# Patient Record
Sex: Male | Born: 1945 | Race: Black or African American | Hispanic: No | Smoking: Former smoker
Health system: Southern US, Community
[De-identification: ages and names within clinical notes are randomized; demographics above are authoritative.]

## PROBLEM LIST (undated history)

## (undated) DIAGNOSIS — Z720 Tobacco use: Secondary | ICD-10-CM

## (undated) DIAGNOSIS — M7989 Other specified soft tissue disorders: Secondary | ICD-10-CM

## (undated) DIAGNOSIS — R0602 Shortness of breath: Secondary | ICD-10-CM

## (undated) DIAGNOSIS — R609 Edema, unspecified: Secondary | ICD-10-CM

## (undated) DIAGNOSIS — E7800 Pure hypercholesterolemia, unspecified: Secondary | ICD-10-CM

## (undated) DIAGNOSIS — R5381 Other malaise: Secondary | ICD-10-CM

## (undated) DIAGNOSIS — K219 Gastro-esophageal reflux disease without esophagitis: Secondary | ICD-10-CM

## (undated) DIAGNOSIS — I499 Cardiac arrhythmia, unspecified: Secondary | ICD-10-CM

## (undated) DIAGNOSIS — M1990 Unspecified osteoarthritis, unspecified site: Secondary | ICD-10-CM

## (undated) DIAGNOSIS — R5383 Other fatigue: Secondary | ICD-10-CM

## (undated) DIAGNOSIS — I10 Essential (primary) hypertension: Secondary | ICD-10-CM

## (undated) DIAGNOSIS — I509 Heart failure, unspecified: Secondary | ICD-10-CM

## (undated) DIAGNOSIS — I219 Acute myocardial infarction, unspecified: Secondary | ICD-10-CM

## (undated) DIAGNOSIS — I2510 Atherosclerotic heart disease of native coronary artery without angina pectoris: Secondary | ICD-10-CM

## (undated) HISTORY — DX: Atherosclerotic heart disease of native coronary artery without angina pectoris: I25.10

## (undated) HISTORY — DX: Unspecified osteoarthritis, unspecified site: M19.90

## (undated) HISTORY — PX: CARDIAC CATHETERIZATION: SHX172

## (undated) HISTORY — DX: Heart failure, unspecified: I50.9

## (undated) HISTORY — DX: Other malaise: R53.81

## (undated) HISTORY — DX: Essential (primary) hypertension: I10

## (undated) HISTORY — DX: Edema, unspecified: R60.9

## (undated) HISTORY — DX: Tobacco use: Z72.0

## (undated) HISTORY — DX: Pure hypercholesterolemia, unspecified: E78.00

## (undated) HISTORY — DX: Other fatigue: R53.83

## (undated) HISTORY — DX: Morbid (severe) obesity due to excess calories: E66.01

## (undated) HISTORY — PX: CORONARY ANGIOPLASTY WITH STENT PLACEMENT: SHX49

## (undated) HISTORY — DX: Other specified soft tissue disorders: M79.89

---

## 2004-11-14 DIAGNOSIS — I2510 Atherosclerotic heart disease of native coronary artery without angina pectoris: Secondary | ICD-10-CM

## 2004-11-14 HISTORY — DX: Atherosclerotic heart disease of native coronary artery without angina pectoris: I25.10

## 2012-01-26 DIAGNOSIS — R079 Chest pain, unspecified: Secondary | ICD-10-CM

## 2012-01-30 ENCOUNTER — Encounter: Payer: Self-pay | Admitting: *Deleted

## 2012-01-30 ENCOUNTER — Ambulatory Visit (INDEPENDENT_AMBULATORY_CARE_PROVIDER_SITE_OTHER): Payer: Medicare Other | Admitting: Cardiovascular Disease

## 2012-01-30 ENCOUNTER — Encounter: Payer: Self-pay | Admitting: Cardiovascular Disease

## 2012-01-30 ENCOUNTER — Telehealth: Payer: Self-pay | Admitting: *Deleted

## 2012-01-30 VITALS — BP 123/68 | HR 65 | Ht 66.0 in | Wt 290.0 lb

## 2012-01-30 DIAGNOSIS — F17200 Nicotine dependence, unspecified, uncomplicated: Secondary | ICD-10-CM

## 2012-01-30 DIAGNOSIS — Z720 Tobacco use: Secondary | ICD-10-CM

## 2012-01-30 DIAGNOSIS — I2510 Atherosclerotic heart disease of native coronary artery without angina pectoris: Secondary | ICD-10-CM

## 2012-01-30 DIAGNOSIS — R079 Chest pain, unspecified: Secondary | ICD-10-CM

## 2012-01-30 DIAGNOSIS — R0602 Shortness of breath: Secondary | ICD-10-CM

## 2012-01-30 DIAGNOSIS — I10 Essential (primary) hypertension: Secondary | ICD-10-CM

## 2012-01-30 DIAGNOSIS — E7800 Pure hypercholesterolemia, unspecified: Secondary | ICD-10-CM

## 2012-01-30 MED ORDER — NITROGLYCERIN 0.4 MG SL SUBL: 0.4000 mg | SUBLINGUAL_TABLET | SUBLINGUAL | Status: DC | PRN

## 2012-01-30 NOTE — Patient Instructions (Addendum)
Go to outpatient registration at [facility] for lab work and a chest x-ray: DO TODAY OR TOMORROW. Your physician has requested that you have a cardiac catheterization. Cardiac catheterization is used to diagnose and/or treat various heart conditions. Doctors may recommend this procedure for a number of different reasons. The most common reason is to evaluate chest pain. Chest pain can be a symptom of coronary artery disease (CAD), and cardiac catheterization can show whether plaque is narrowing or blocking your heart's arteries. This procedure is also used to evaluate the valves, as well as measure the blood flow and oxygen levels in different parts of your heart. For further information please visit [email or web address] Please follow instruction sheet, as given. Nitroglycerin-Place one tablet under tongue every 5 minutes up to 3 doses as needed for chest pain. No more than 3 doses over a 15 minute period.

## 2012-01-30 NOTE — Telephone Encounter (Signed)
Spoke with pt and notified him regarding abn stress test. He states he had a stent placed about 5 yrs ago in [place], [place]. He will come into the office around 1 following his Echo at [facility].   Requested records from [facility].

## 2012-01-30 NOTE — Telephone Encounter (Signed)
No precert required.  Medicare only 

## 2012-01-30 NOTE — Telephone Encounter (Signed)
Spoke with [name] at [employer]. She confirmed phone number of [phone]. Pt has appt for Echo with them at 11am today. Notified [name] that Dr. Kirke Corin would like to see pt in the office today to discuss abn stress test. She will confirm with pt at time of Echo that he has spoken with [place].  Left message for pt to call back on voicemail.

## 2012-01-30 NOTE — Progress Notes (Signed)
HPI   this is a 66 year old male who is here today for an urgent evaluation Re: chest pain and significantly abnormal nuclear stress test. The patient has known history of coronary artery disease status post angioplasty and stent placement in 2006 in [place], [place].  He has not had any cardiac events since then and has been on dual antiplatelet therapy since that time. He has chronic medical conditions that include diet-controlled type 2 diabetes , hypertension , hyperlipidemia , tobacco use and morbid obesity.  He recently went to a regular check up with his primary care physician Dr. Sherryll Burger. At that time he complained of lower extremity edema as well as progressive chest discomfort. He had a lower extremity Doppler which showed no evidence of DVT. The patient reports progressive substernal chest tightness which happens only with activities. This started about a year ago and has been progressive since then. The patient gradually cut down his activities as he knows when to expect to have chest tightness.  The tightness usually lasts for about 5 or 10 minutes and resolves with rest. He has not had to use nitroglycerin and does not have it. He underwent a nuclear stress test on Friday. He had 1 mm of ST depression in the inferior and inferior lateral leads associated with chest tightness and few PVCs. His nuclear scan showed evidence of 2 vessel ischemia in the LAD as well as left circumflex distributions with normal ejection fraction.  Allergies  Allergen Reactions  . Ciprofloxacin Hcl Other (See Comments)    Fluoroquinolones:difficulty swallowing and Sore mouth     No current outpatient prescriptions on file prior to visit.     Past Medical History  Diagnosis Date  . Other malaise and fatigue   . Swelling of limb   . Edema   . Osteoarthrosis, unspecified whether generalized or localized, unspecified site   . Congestive heart failure, unspecified   . CAD (coronary artery disease), native  coronary artery 2006    Stent placed in 2006 in [place], [place]  . Pure hypercholesterolemia   . Hypertension   . Morbid obesity   . Tobacco abuse      History reviewed. No pertinent past surgical history.   History reviewed. No pertinent family history.   History   Social History  . Marital Status: Divorced    Spouse Name: N/A    Number of Children: N/A  . Years of Education: N/A   Occupational History  . UNEMPLOYED    Social History Main Topics  . Smoking status: Current Everyday Smoker -- 0.5 packs/day for 30 years    Types: Cigarettes  . Smokeless tobacco: Never Used  . Alcohol Use: Not on file  . Drug Use: Not on file  . Sexually Active: Not on file   Other Topics Concern  . Not on file   Social History Narrative  . No narrative on file     ROS Constitutional: Negative for fever, chills, diaphoresis, activity change, appetite change and fatigue.  HENT: Negative for hearing loss, nosebleeds, congestion, sore throat, facial swelling, drooling, trouble swallowing, neck pain, voice change, sinus pressure and tinnitus.  Eyes: Negative for photophobia, pain, discharge and visual disturbance.  Respiratory: Negative for apnea, cough  and wheezing.  Cardiovascular: Negative for  palpitations.  Gastrointestinal: Negative for nausea, vomiting, abdominal pain, diarrhea, constipation, blood in stool and abdominal distention.  Genitourinary: Negative for dysuria, urgency, frequency, hematuria and decreased urine volume.  Musculoskeletal: Negative for myalgias, back pain, joint swelling,  arthralgias and gait problem.  Skin: Negative for color change, pallor, rash and wound.  Neurological: Negative for dizziness, tremors, seizures, syncope, speech difficulty, weakness, light-headedness, numbness and headaches.  Psychiatric/Behavioral: Negative for suicidal ideas, hallucinations, behavioral problems and agitation. The patient is not nervous/anxious.     PHYSICAL EXAM   BP  123/68  Pulse 65  Ht 5\' 6"  (1.676 m)  Wt 290 lb (131.543 kg)  BMI 46.81 kg/m2  SpO2 96%  Constitutional: He is oriented to person, place, and time. He appears well-developed and well-nourished. No distress.  HENT: No nasal discharge.  Head: Normocephalic and atraumatic.  Eyes: Pupils are equal and round. Right eye exhibits no discharge. Left eye exhibits no discharge.  Neck: Normal range of motion. Neck supple. No JVD present. No thyromegaly present.  Cardiovascular: Normal rate, regular rhythm, normal heart sounds and. Exam reveals no gallop and no friction rub. No murmur heard.  Pulmonary/Chest: Effort normal and breath sounds normal. No stridor. No respiratory distress. He has no wheezes. He has no rales. He exhibits no tenderness.  Abdominal: Soft. Bowel sounds are normal. He exhibits no distension. There is no tenderness. There is no rebound and no guarding.  Musculoskeletal: Normal range of motion. He exhibits no edema and no tenderness.  Neurological: He is alert and oriented to person, place, and time. Coordination normal.  Skin: Skin is warm and dry. No rash noted. He is not diaphoretic. No erythema. No pallor.  Psychiatric: He has a normal mood and affect. His behavior is normal. Judgment and thought content normal.   radial pulse is normal on both sides    EKG:  Normal sinus rhythm with nonspecific ST and T wave changes.   ASSESSMENT AND PLAN

## 2012-01-30 NOTE — Telephone Encounter (Signed)
Pre-cert Cath (L) heart (radial approach) Main Lab Dr. Excell Seltzer 02/06/12

## 2012-01-30 NOTE — Assessment & Plan Note (Signed)
Mr. Galyean  Has known history of coronary artery disease status post angioplasty and stent placement in 2006. The details are not available. He has not had any cardiac events since then and has been on dual antiplatelet therapy since that time. He reports progressive exertional chest tightness currently consistent with class III angina in spite of medical therapy. His stress test was very abnormal with significant anteroseptal/ anterior ischemia as well as inferolateral ischemia with normal ejection fraction.  Due to all of that, I recommend proceeding with urgent cardiac catheterization and possible coronary intervention. I advised him to have the procedure done this week but he is not able to due to some social reasons. He would like it done next week. He will be scheduled next week on Monday  with Dr. Excell Seltzer  As I am not available to do the procedure this week or next week.  Due to his morbid obesity, I recommend radial acces approach.  He is to continue treatment with aspirin and Plavix. He will be provided with nitroglycerin sublingual as needed. I instructed him on the proper use as well as the need to call [phone] if symptoms do not resolve after one nitroglycerin.

## 2012-01-30 NOTE — Assessment & Plan Note (Signed)
Continue treatment with atorvastatin. I recommend a target LDL of less than 70. 

## 2012-01-30 NOTE — Assessment & Plan Note (Signed)
His blood pressure is well controlled. Continue current medications. 

## 2012-01-30 NOTE — Telephone Encounter (Signed)
Message copied by Arlyss Gandy on Mon Jan 30, 2012  8:26 AM ------      Message from: Lorine Bears A      Created: Fri Jan 27, 2012  5:46 PM      Regarding: abnormal stress test       This patient had a stress test today (Friday). It was very abnormal. I tried calling him but the number did not work.       He needs to be seen urgently on Monday to set up for cardiac cath. His PCP is Dr. Sherryll Burger. Let me know

## 2012-01-30 NOTE — Assessment & Plan Note (Signed)
The patient will need to be counseled regarding the importance of smoking cessation.

## 2012-01-31 ENCOUNTER — Encounter (HOSPITAL_COMMUNITY): Payer: Self-pay | Admitting: Pharmacy Technician

## 2012-02-06 ENCOUNTER — Ambulatory Visit (HOSPITAL_COMMUNITY): Payer: Medicare Other

## 2012-02-06 ENCOUNTER — Encounter (HOSPITAL_COMMUNITY)
Admission: RE | Disposition: A | Discharge status: Home / Self Care | Payer: Self-pay | Source: Ambulatory Visit | Attending: Cardiovascular Disease

## 2012-02-06 ENCOUNTER — Other Ambulatory Visit: Payer: Self-pay

## 2012-02-06 ENCOUNTER — Ambulatory Visit (HOSPITAL_COMMUNITY)
Admission: RE | Admit: 2012-02-06 | Discharge: 2012-02-07 | Disposition: A | Discharge status: Home / Self Care | Payer: Medicare Other | Source: Ambulatory Visit | Attending: Cardiovascular Disease | Admitting: Cardiovascular Disease

## 2012-02-06 ENCOUNTER — Encounter (HOSPITAL_COMMUNITY): Payer: Self-pay | Admitting: General Practice

## 2012-02-06 DIAGNOSIS — I2510 Atherosclerotic heart disease of native coronary artery without angina pectoris: Secondary | ICD-10-CM

## 2012-02-06 DIAGNOSIS — Z720 Tobacco use: Secondary | ICD-10-CM | POA: Insufficient documentation

## 2012-02-06 DIAGNOSIS — K029 Dental caries, unspecified: Secondary | ICD-10-CM | POA: Insufficient documentation

## 2012-02-06 DIAGNOSIS — I5030 Unspecified diastolic (congestive) heart failure: Secondary | ICD-10-CM | POA: Insufficient documentation

## 2012-02-06 DIAGNOSIS — F17200 Nicotine dependence, unspecified, uncomplicated: Secondary | ICD-10-CM | POA: Insufficient documentation

## 2012-02-06 DIAGNOSIS — Z01810 Encounter for preprocedural cardiovascular examination: Secondary | ICD-10-CM | POA: Insufficient documentation

## 2012-02-06 DIAGNOSIS — Z9861 Coronary angioplasty status: Secondary | ICD-10-CM | POA: Insufficient documentation

## 2012-02-06 DIAGNOSIS — R079 Chest pain, unspecified: Secondary | ICD-10-CM

## 2012-02-06 DIAGNOSIS — I509 Heart failure, unspecified: Secondary | ICD-10-CM | POA: Insufficient documentation

## 2012-02-06 DIAGNOSIS — I10 Essential (primary) hypertension: Secondary | ICD-10-CM | POA: Insufficient documentation

## 2012-02-06 DIAGNOSIS — I200 Unstable angina: Secondary | ICD-10-CM | POA: Insufficient documentation

## 2012-02-06 DIAGNOSIS — E119 Type 2 diabetes mellitus without complications: Secondary | ICD-10-CM | POA: Insufficient documentation

## 2012-02-06 DIAGNOSIS — E7800 Pure hypercholesterolemia, unspecified: Secondary | ICD-10-CM | POA: Insufficient documentation

## 2012-02-06 HISTORY — PX: LEFT HEART CATHETERIZATION WITH CORONARY ANGIOGRAM: SHX5451

## 2012-02-06 HISTORY — DX: Acute myocardial infarction, unspecified: I21.9

## 2012-02-06 HISTORY — DX: Shortness of breath: R06.02

## 2012-02-06 LAB — GLUCOSE, CAPILLARY
Glucose-Capillary: 112 mg/dL — ABNORMAL HIGH (ref 70–99)
Glucose-Capillary: 89 mg/dL (ref 70–99)
Glucose-Capillary: 105 mg/dL — ABNORMAL HIGH (ref 70–99)

## 2012-02-06 SURGERY — LEFT HEART CATHETERIZATION WITH CORONARY ANGIOGRAM
Anesthesia: LOCAL

## 2012-02-06 MED ORDER — HEPARIN (PORCINE) IN NACL 100-0.45 UNIT/ML-% IJ SOLN
1550.0000 [IU]/h | INTRAMUSCULAR | Status: DC
  Administered 2012-02-06: 1300 [IU]/h via INTRAVENOUS
  Administered 2012-02-07: 1550 [IU]/h via INTRAVENOUS
  Filled 2012-02-06 (×3): qty 250

## 2012-02-06 MED ORDER — NITROGLYCERIN 0.4 MG SL SUBL: 0.4000 mg | SUBLINGUAL_TABLET | SUBLINGUAL | Status: DC | PRN

## 2012-02-06 MED ORDER — ATORVASTATIN CALCIUM 40 MG PO TABS
40.0000 mg | ORAL_TABLET | Freq: Every day | ORAL | Status: DC
  Administered 2012-02-07: 40 mg via ORAL
  Filled 2012-02-06: qty 1

## 2012-02-06 MED ORDER — ACETAMINOPHEN 325 MG PO TABS: 650.0000 mg | ORAL_TABLET | ORAL | Status: DC | PRN

## 2012-02-06 MED ORDER — METOPROLOL TARTRATE 25 MG PO TABS
25.0000 mg | ORAL_TABLET | Freq: Every day | ORAL | Status: DC
  Administered 2012-02-07: 25 mg via ORAL
  Filled 2012-02-06: qty 1

## 2012-02-06 MED ORDER — ASPIRIN 81 MG PO CHEW
CHEWABLE_TABLET | ORAL | Status: AC
  Administered 2012-02-06: 324 mg via ORAL
  Filled 2012-02-06: qty 4

## 2012-02-06 MED ORDER — SODIUM CHLORIDE 0.9 % IJ SOLN
3.0000 mL | Freq: Two times a day (BID) | INTRAMUSCULAR | Status: DC
  Administered 2012-02-06 – 2012-02-07 (×2): 3 mL via INTRAVENOUS

## 2012-02-06 MED ORDER — SODIUM CHLORIDE 0.9 % IJ SOLN: 3.0000 mL | INTRAMUSCULAR | Status: DC | PRN

## 2012-02-06 MED ORDER — SODIUM CHLORIDE 0.9 % IV SOLN
INTRAVENOUS | Status: DC
  Administered 2012-02-06: 07:00:00 via INTRAVENOUS

## 2012-02-06 MED ORDER — ASPIRIN 81 MG PO CHEW
324.0000 mg | CHEWABLE_TABLET | ORAL | Status: AC
  Administered 2012-02-06: 324 mg via ORAL

## 2012-02-06 MED ORDER — ALBUTEROL SULFATE (5 MG/ML) 0.5% IN NEBU
2.5000 mg | INHALATION_SOLUTION | Freq: Once | RESPIRATORY_TRACT | Status: AC
  Administered 2012-02-06: 2.5 mg via RESPIRATORY_TRACT

## 2012-02-06 MED ORDER — LISINOPRIL 10 MG PO TABS
10.0000 mg | ORAL_TABLET | Freq: Every day | ORAL | Status: DC
  Administered 2012-02-07: 10 mg via ORAL
  Filled 2012-02-06: qty 1

## 2012-02-06 MED ORDER — MIDAZOLAM HCL 2 MG/2ML IJ SOLN
INTRAMUSCULAR | Status: AC
  Filled 2012-02-06: qty 2

## 2012-02-06 MED ORDER — SODIUM CHLORIDE 0.9 % IV SOLN
1.0000 mL/kg/h | INTRAVENOUS | Status: AC
  Administered 2012-02-06: 1 mL/kg/h via INTRAVENOUS

## 2012-02-06 MED ORDER — SODIUM CHLORIDE 0.9 % IV SOLN: 250.0000 mL | INTRAVENOUS | Status: DC | PRN

## 2012-02-06 MED ORDER — COLCHICINE 0.6 MG PO TABS
0.6000 mg | ORAL_TABLET | Freq: Two times a day (BID) | ORAL | Status: DC | PRN
  Filled 2012-02-06: qty 1

## 2012-02-06 MED ORDER — LIDOCAINE HCL (PF) 1 % IJ SOLN
INTRAMUSCULAR | Status: AC
  Filled 2012-02-06: qty 30

## 2012-02-06 MED ORDER — SODIUM CHLORIDE 0.9 % IV SOLN: 250.0000 mL | INTRAVENOUS | Status: DC

## 2012-02-06 MED ORDER — ASPIRIN EC 81 MG PO TBEC
81.0000 mg | DELAYED_RELEASE_TABLET | Freq: Every day | ORAL | Status: DC
  Administered 2012-02-07: 81 mg via ORAL
  Filled 2012-02-06: qty 1

## 2012-02-06 MED ORDER — FENTANYL CITRATE 0.05 MG/ML IJ SOLN
INTRAMUSCULAR | Status: AC
  Filled 2012-02-06: qty 2

## 2012-02-06 MED ORDER — ONDANSETRON HCL 4 MG/2ML IJ SOLN: 4.0000 mg | Freq: Four times a day (QID) | INTRAMUSCULAR | Status: DC | PRN

## 2012-02-06 MED ORDER — SODIUM CHLORIDE 0.9 % IJ SOLN: 3.0000 mL | Freq: Two times a day (BID) | INTRAMUSCULAR | Status: DC

## 2012-02-06 MED ORDER — FUROSEMIDE 40 MG PO TABS
40.0000 mg | ORAL_TABLET | Freq: Every day | ORAL | Status: DC
  Administered 2012-02-06 – 2012-02-07 (×2): 40 mg via ORAL
  Filled 2012-02-06 (×2): qty 1

## 2012-02-06 MED ORDER — HEPARIN (PORCINE) IN NACL 2-0.9 UNIT/ML-% IJ SOLN
INTRAMUSCULAR | Status: AC
  Filled 2012-02-06: qty 2000

## 2012-02-06 MED ORDER — NITROGLYCERIN 0.2 MG/ML ON CALL CATH LAB
INTRAVENOUS | Status: AC
  Filled 2012-02-06: qty 1

## 2012-02-06 NOTE — Consult Note (Signed)
Reason for Consult:3 vessel CAD Referring Physician: Dr. Michael Boston [name] is an 67 y.o. male.  HPI: 66 yo male with history of CAD, s/p PTCA/ stent in [place] in 2006. On aspirin and Plavix since that time. He is morbidly obese, continues to smoke and has HTN and hypercholesterolemia. He has been having CP for about a year. Usually exertional, relieved with rest and he has not had to take NTG. He says CP has awakened him from sleep a couple of times, but not recently. He had a cardiolite which showed significant ischemia and was scheduled for cath today. At cath was found to have 3 vessel CAD. Currently pain free.  Past Medical History  Diagnosis Date  . Other malaise and fatigue   . Swelling of limb   . Edema   . Osteoarthrosis, unspecified whether generalized or localized, unspecified site   . Congestive heart failure, unspecified   . CAD (coronary artery disease), native coronary artery 2006    Stent placed in 2006 in [place], [place]  . Pure hypercholesterolemia   . Hypertension   . Morbid obesity   . Tobacco abuse   . Myocardial infarction   . Shortness of breath   . Diabetes mellitus     Past Surgical History  Procedure Date  . Cardiac catheterization   . Coronary angioplasty with stent placement     History reviewed. No pertinent family history.  Social History:  reports that he has been smoking Cigarettes.  He has a 15 pack-year smoking history. He has never used smokeless tobacco. He reports that he does not drink alcohol or use illicit drugs.  Allergies:  Allergies  Allergen Reactions  . Ciprofloxacin Hcl Other (See Comments)    Fluoroquinolones:difficulty swallowing and Sore mouth    Medications:  I have reviewed the patient's current medications. Prior to Admission:  Prescriptions prior to admission  Medication Sig Dispense Refill  . aspirin EC 81 MG tablet Take 81 mg by mouth daily.      [name] atorvastatin (LIPITOR) 40 MG tablet Take 40 mg by mouth  daily.      . clopidogrel (PLAVIX) 75 MG tablet Take 75 mg by mouth daily.      . colchicine 0.6 MG tablet Take 0.6 mg by mouth 2 (two) times daily as needed. For gout flare      . furosemide (LASIX) 40 MG tablet Take 40 mg by mouth daily.      [name] lisinopril (PRINIVIL,ZESTRIL) 10 MG tablet Take 10 mg by mouth daily.      . metoprolol (LOPRESSOR) 50 MG tablet Take 25 mg by mouth daily.      . nitroGLYCERIN (NITROSTAT) 0.4 MG SL tablet Place 0.4 mg under the tongue every 5 (five) minutes as needed. For chest pain        Results for orders placed during the hospital encounter of 02/06/12 (from the past 48 hour(s))  GLUCOSE, CAPILLARY     Status: Abnormal   Collection Time   02/06/12  7:24 AM      Component Value Range Comment   Glucose-Capillary 118 (*) 70 - 99 (mg/dL)   GLUCOSE, CAPILLARY     Status: Abnormal   Collection Time   02/06/12 10:09 AM      Component Value Range Comment   Glucose-Capillary 112 (*) 70 - 99 (mg/dL)     No results found.  Review of Systems  Constitutional: Positive for malaise/fatigue. Negative for fever, chills and weight loss.  Eyes:  Negative.   Respiratory: Positive for shortness of breath, wheezing and stridor. Negative for cough and sputum production.   Cardiovascular: Positive for chest pain and leg swelling. Negative for claudication.  Gastrointestinal: Negative.   Genitourinary: Negative.   Musculoskeletal: Positive for joint pain.  Skin:       Healed ulcer left leg  Neurological: Negative.   Endo/Heme/Allergies: Does not bruise/bleed easily.  Psychiatric/Behavioral: Negative.   All other systems reviewed and are negative.   Blood pressure 153/81, pulse 55, temperature 98.7 F (37.1 C), temperature source Oral, resp. rate 18, height 5\' 6"  (1.676 m), weight 293 lb (132.904 kg), SpO2 100.00%. Physical Exam  Constitutional: He is oriented to person, place, and time. No distress.       Morbidly obese  HENT:  Head: Normocephalic and atraumatic.    Eyes: EOM are normal. Pupils are equal, round, and reactive to light.  Neck: Neck supple. No JVD present. No tracheal deviation present. No thyromegaly present.  Cardiovascular: Normal rate, regular rhythm and intact distal pulses.  Exam reveals no gallop and no friction rub.   Murmur (2/6 systolic) heard. Respiratory: Effort normal and breath sounds normal. Stridor present. He has no wheezes. He has no rales.  GI: Soft. There is no tenderness.       obese  Musculoskeletal: He exhibits edema (trace).       Chronic venous stasis changes both LE  Lymphadenopathy:    He has no cervical adenopathy.  Neurological: He is alert and oriented to person, place, and time.  Skin: Skin is warm and dry.  Psychiatric: He has a normal mood and affect.    Assessment/Plan: 66 yo male with known CAD and multiple medical and lifestyle CRF who presents with angina, primarily exertional. He had a positive cardiolite and at cath has severe 3 vessel CAD. CABG is indicated for survival benefit and relief of symptoms.   I have discussed with the patient and his family the general nature of the procedure, need for general anesthesia,and incisions to be used. I have discussed the expected hospital stay, overall recovery and short and long term outcomes. They understand the risks include but are not limited to death, stroke, MI, DVT/PE, bleeding, possible need for transfusion, infections, other organ system dysfunction including respiratory, renal, or GI complications. He understands and accepts these risks and wishes to proceed.  I advised him that he needs to be off Plavix for 5-7 days prior to surgery. Therefore planned surgery would be Monday February 13, 2012.  He insists he needs to leave the hospital prior to surgery. I advised him that there would be significant risk to doing that.  He has extremely poor dentition and needs an orthopantogram and dental evaluation preoperatively.   [name]  C 02/06/2012, 4:11 PM

## 2012-02-06 NOTE — Progress Notes (Signed)
ANTICOAGULATION CONSULT NOTE - Initial Consult  Pharmacy Consult for Heparin Indication: chest pain/ACS  Allergies  Allergen Reactions  . Ciprofloxacin Hcl Other (See Comments)    Fluoroquinolones:difficulty swallowing and Sore mouth    Patient Measurements: Height: 5\' 6"  (167.6 cm) Weight: 293 lb (132.904 kg) IBW/kg (Calculated) : 63.8  Heparin Dosing Weight: 95.7  Vital Signs: Temp: 98.7 F (37.1 C) (03/25 1315) Temp src: Oral (03/25 1315) BP: 153/81 mmHg (03/25 0714) Pulse Rate: 55  (03/25 0857)  Labs: No results found for this basename: HGB:2,HCT:3,PLT:3,APTT:3,LABPROT:3,INR:3,HEPARINUNFRC:3,CREATININE:3,CKTOTAL:3,CKMB:3,TROPONINI:3 in the last 72 hours CrCl is unknown because no creatinine reading has been taken.  Medical History: Past Medical History  Diagnosis Date  . Other malaise and fatigue   . Swelling of limb   . Edema   . Osteoarthrosis, unspecified whether generalized or localized, unspecified site   . Congestive heart failure, unspecified   . CAD (coronary artery disease), native coronary artery 2006    Stent placed in 2006 in [place], [place]  . Pure hypercholesterolemia   . Hypertension   . Morbid obesity   . Tobacco abuse     Assessment: 66 yo M admitted after outpatient cath demonstrated severe 3V CAD and awaiting surgery consult.  To be started on heparin 8 hours after sheath pull (pulled at 0948).  Goal of Therapy:  Heparin level 0.3-0.7 units/ml   Plan:  1.  Begin heparin at 1300 units/hr (no bolus)-begin at 1800 2.  Check heparin level 6 hours after beginning 3.  Daily heparin level/CBC  Rolland Porter, Pharm.D., BCPS Clinical Pharmacist Pager: [phone]

## 2012-02-06 NOTE — CV Procedure (Signed)
Cardiac Catheterization Procedure Note  Name: [name] MRN: [id number] DOB: [birth year]  Procedure: Left Heart Cath, Selective Coronary Angiography, LV angiography  Indication: This is a 66 year old gentleman presenting for outpatient cardiac catheterization. He has coronary artery disease and presented with crescendo angina. A nuclear scan was done and demonstrated multi-care toward ischemia with a large, reversible defect in the anterior wall and a second reversible defect in the inferolateral wall. The patient has known CAD and underwent PCI back in 2006 in [place]. At that time he was treated with a 2.25 x 12 mm mini vision bare metal stent in the posterolateral branch of the right coronary artery. He was noted to have mild nonobstructive disease in the LAD and left circumflex vessels and normal LV function at that point. The patient has developed type 2 diabetes but he does not take medication for this as it has been "mild" by the patient's report. He is morbidly obese.   Procedural Details: The right wrist was prepped, draped, and anesthetized with 1% lidocaine. Using the modified Seldinger technique, a 5 French sheath was introduced into the right radial artery. 3 mg of verapamil was administered through the sheath, weight-based unfractionated heparin was administered intravenously. Standard Judkins catheters were used for selective coronary angiography and left ventriculography. Catheter exchanges were performed over an exchange length guidewire. There were no immediate procedural complications. A TR band was used for radial hemostasis at the completion of the procedure.  The patient was transferred to the post catheterization recovery area for further monitoring.  Procedural Findings: Hemodynamics: AO 114/61 LV 114/21  Coronary angiography: Coronary dominance: right  Left mainstem: The left main is patent with mild tapering at the distal vessel but no more than 20% stenosis is  noted. The left main divides into the LAD and left circumflex.  Left anterior descending (LAD): The LAD has critical stenosis in the mid vessel. The proximal vessel has mild luminal irregularity until the first septal perforator. Just beyond the first perforator there is 95-99% stenosis present. There is a dilated segment just at the origin of the second diagonal branch. The LAD is segmentally diseased in the mid vessel with 90% stenosis beyond the diagonal origin. The diagonal branch has no obstructive disease present. The distal LAD has diffuse nonobstructive disease.  Left circumflex (LCx): The left circumflex is patent in the proximal aspect. The mid vessel has sequential high-grade lesions with the first lesion having 75-80% stenosis and the second lesion with at least 80% stenosis leading into a large second obtuse marginal branch. The second OM also has a high-grade lesion in the distal portion of that vessel that is only appreciated in the cranial views.  Right coronary artery (RCA): The right coronary artery is a large, dominant vessel. The vessel is diffusely diseased with no high-grade obstruction through the proximal and midportion. At the junction of the mid and distal RCA there is a 60% smooth stenosis present. The vessel gives off a large PDA branch with diffuse disease in the 50-70% range throughout the proximal PDA. The posterolateral branch is occluded and the stent is not visible. The posterolateral branch fills from left to right collaterals.  Left ventriculography: Left ventricular systolic function is normal, LVEF is estimated at 55-65%, there is no significant mitral regurgitation   Final Conclusions:   1. Severe three-vessel coronary artery disease with critical stenosis in the mid LAD, severe stenosis of the left circumflex, and total occlusion of a small branch of the right coronary artery  with moderate disease in the remaining portions of the RCA. 2. Preserved LV  function  Recommendations: The patient has a large amount of myocardium in jeopardy. He is coronary disease is anatomically amenable to PCI. A large diagonal branch would have to be crossed with a stent in the mid LAD, but the ostium of the diagonal is widely patent and I suspect there would be no significant residual stenosis after treating that area. However, the patient would require at least 3 stents (1 in the mid LAD, 2 in the left circumflex). In this patient who has developed type 2 diabetes and has shown no pattern of medical compliance as he continues to smoke and does not follow a diet, I'm not sure that percutaneous treatment is his best therapy. I am going to request consultation from cardiac surgery for consideration of coronary bypass. If he is deemed not to be a good candidate because of his morbid obesity, I will proceed with PCI later this week. In the interim, will hold his Plavix since surgery is being considered. He will be started on IV heparin. All of this was discussed with the patient and his family in detail.  Tonny Bollman 02/06/2012, 10:08 AM

## 2012-02-06 NOTE — Progress Notes (Signed)
Pre-op Cardiac Surgery  Carotid Findings:  No significant ICA stenosis bilaterally. Vertebrals antegrade bilaterally.  Upper Extremity Right Left  Brachial Pressures 127 Triphasic 132 Triphasic  Radial Waveforms Tri Tri  Ulnar Waveforms Tri Tri  Palmar Arch (Allen's Test) Normal Normal      Lower  Extremity Right Left  Dorsalis Pedis 142 Tri 157 Tri  Anterior Tibial    Posterior Tibial 147 Tri 138 Tri  Ankle/Brachial Indices 1.11 1.19    Findings: Bilateral normal ABI   Elpidio Galea RDMS, RDCS Farrel Demark RDMS

## 2012-02-07 ENCOUNTER — Encounter (HOSPITAL_COMMUNITY): Payer: Self-pay | Admitting: Cardiology

## 2012-02-07 DIAGNOSIS — I2510 Atherosclerotic heart disease of native coronary artery without angina pectoris: Secondary | ICD-10-CM

## 2012-02-07 LAB — CBC
Hemoglobin: 14.2 g/dL (ref 13.0–17.0)
MCH: 29.3 pg (ref 26.0–34.0)
MCHC: 32.6 g/dL (ref 30.0–36.0)
MCV: 89.9 fL (ref 78.0–100.0)
RBC: 4.85 MIL/uL (ref 4.22–5.81)

## 2012-02-07 LAB — HEPARIN LEVEL (UNFRACTIONATED): Heparin Unfractionated: 0.22 IU/mL — ABNORMAL LOW (ref 0.30–0.70)

## 2012-02-07 LAB — GLUCOSE, CAPILLARY: Glucose-Capillary: 113 mg/dL — ABNORMAL HIGH (ref 70–99)

## 2012-02-07 MED ORDER — NICOTINE 14 MG/24HR TD PT24
14.0000 mg | MEDICATED_PATCH | Freq: Every day | TRANSDERMAL | Status: DC
  Administered 2012-02-07: 14 mg via TRANSDERMAL
  Filled 2012-02-07 (×2): qty 1

## 2012-02-07 MED ORDER — CHLORHEXIDINE GLUCONATE 0.12 % MT SOLN: 15.0000 mL | Freq: Two times a day (BID) | OROMUCOSAL | Status: DC

## 2012-02-07 MED ORDER — NICOTINE 14 MG/24HR TD PT24: 1.0000 | MEDICATED_PATCH | TRANSDERMAL | Status: DC

## 2012-02-07 NOTE — Progress Notes (Signed)
ANTICOAGULATION CONSULT NOTE - Follow Up  Pharmacy Consult for Heparin Indication: chest pain/ACS  Allergies  Allergen Reactions  . Ciprofloxacin Hcl Other (See Comments)    Fluoroquinolones:difficulty swallowing and Sore mouth    Patient Measurements: Height: 5\' 6"  (167.6 cm) Weight: 295 lb 6.7 oz (134 kg) IBW/kg (Calculated) : 63.8  Heparin Dosing Weight: 95.7  Vital Signs: Temp: 97.6 F (36.4 C) (03/26 0018) Temp src: Oral (03/26 0018) BP: 100/35 mmHg (03/26 0018) Pulse Rate: 58  (03/26 0018)  Labs:  Basename 02/07/12 0019  HGB --  HCT --  PLT --  APTT --  LABPROT --  INR --  HEPARINUNFRC 0.22*  CREATININE --  CKTOTAL --  CKMB --  TROPONINI --   CrCl is unknown because no creatinine reading has been taken.  Medical History: Past Medical History  Diagnosis Date  . Other malaise and fatigue   . Swelling of limb   . Edema   . Osteoarthrosis, unspecified whether generalized or localized, unspecified site   . Congestive heart failure, unspecified   . CAD (coronary artery disease), native coronary artery 2006    Stent placed in 2006 in [place], [place]  . Pure hypercholesterolemia   . Hypertension   . Morbid obesity   . Tobacco abuse   . Myocardial infarction   . Shortness of breath   . Diabetes mellitus     Assessment: 66 yo M admitted after outpatient cath demonstrated severe 3V CAD and awaiting surgery consult. Heparin level (0.22) is below-goal on 1300 units/hr. No problem with line per RN.   Goal of Therapy:  Heparin level 0.3-0.7 units/ml   Plan:  1. Increase IV heparin to 1550 units/hr.  2. Heparin level in 6 hours.   Lorre Munroe, PharmD 02/07/12, 0100.

## 2012-02-07 NOTE — Progress Notes (Signed)
ANTICOAGULATION CONSULT NOTE - Follow Up Consult  Pharmacy Consult for heparin Indication: chest pain/ACS  Allergies  Allergen Reactions  . Ciprofloxacin Hcl Other (See Comments)    Fluoroquinolones:difficulty swallowing and Sore mouth    Patient Measurements: Height: 5\' 6"  (167.6 cm) Weight: 295 lb 6.7 oz (134 kg) IBW/kg (Calculated) : 63.8  Heparin Dosing Weight: 95.7  Vital Signs: Temp: 97.7 F (36.5 C) (03/26 0700) Temp src: Oral (03/26 0700) BP: 148/68 mmHg (03/26 0700) Pulse Rate: 56  (03/26 0700)  Labs:  Basename 02/07/12 1225 02/07/12 0640 02/07/12 0019  HGB -- 14.2 --  HCT -- 43.6 --  PLT -- 175 --  APTT -- -- --  LABPROT -- -- --  INR -- -- --  HEPARINUNFRC 0.65 0.60 0.22*  CREATININE -- -- --  CKTOTAL -- -- --  CKMB -- -- --  TROPONINI -- -- --   CrCl is unknown because no creatinine reading has been taken.   Assessment: 66 yo M admitted after outpatient cath demonstrated severe 3V CAD and plan for OHS on 02/13/12. Confirmatory heparin level therapeutic at 0.65. No bleeding noted.  Patient saying he needs to go home prior to surgery.  Goal of Therapy:  Heparin level 0.3-0.7 units/ml   Plan:  1.  Continue heparin at 1550 units/hr 2.  Daily CBC, heparin level (if patient remains)  Rolland Porter, Pharm.D., BCPS Clinical Pharmacist Pager: [phone]

## 2012-02-07 NOTE — Discharge Summary (Signed)
Full note this am. cdm 

## 2012-02-07 NOTE — Consult Note (Signed)
Pt smokes 1/2 ppd and is in action stage ready to quit. Recommended 14 mg patch x 2 weeks and 7 mg patch x 2 weeks. Discussed patch use instructions and how to taper. Pt requests patch here in the hospital. informe PA of pt's request verbally. Referred to 1-800 quit now for f/u and support. Discussed oral fixation substitutes, second hand smoke and in home smoking policy. Reviewed and gave pt Written education/contact information.

## 2012-02-07 NOTE — Progress Notes (Signed)
Cardiology Progress Note Patient Name: [name] Date of Encounter: 02/07/2012, 9:02 AM     Subjective  Patient feels well this morning without complaints. Denies chest pain, sob, or palpitations. Is adamant about going home ASAP as he has social issues to deal with.    Objective   Telemetry: Sinus bradycardia 40s-70s, 3 episodes of NSVT (6, 8, & 11 beats), patient asymptomatic  Medications: . albuterol  2.5 mg Nebulization Once  . aspirin EC  81 mg Oral Daily  . atorvastatin  40 mg Oral Daily  . furosemide  40 mg Oral Daily  . lisinopril  10 mg Oral Daily  . metoprolol  25 mg Oral Daily  . sodium chloride  3 mL Intravenous Q12H   . heparin 1,550 Units/hr (02/07/12 4540)    Physical Exam: Temp:  [97.4 F (36.3 C)-98.7 F (37.1 C)] 97.7 F (36.5 C) (03/26 0700) Pulse Rate:  [55-60] 56  (03/26 0700) Resp:  [13-18] 16  (03/26 0700) BP: (95-148)/(35-68) 148/68 mmHg (03/26 0700) SpO2:  [96 %-99 %] 99 % (03/26 0700) Weight:  [295 lb 6.7 oz (134 kg)] 295 lb 6.7 oz (134 kg) (03/26 0018)  General: Morbidly obese black male, in no acute distress. Head: Normocephalic, atraumatic, sclera non-icteric, nares are without discharge.  Neck: Supple. Negative for carotid bruits or JVD. Lungs: Distant breath sounds, without wheezes, rales, or rhonchi. Breathing is unlabored. Heart: Distant heart sounds, RRR S1 S2 without murmurs, rubs, or gallops.  Abdomen: Obese, Soft, non-tender, non-distended with normoactive bowel sounds. No rebound/guarding. No obvious abdominal masses. Msk:  Strength and tone appear normal for age. Extremities: Trace BLE edema with chronic venous stasis changes. No clubbing or cyanosis. Distal pedal pulses are intact and equal bilaterally. Neuro: Alert and oriented X 3. Moves all extremities spontaneously. Psych:  Responds to questions appropriately with a normal affect.   Intake/Output Summary (Last 24 hours) at 02/07/12 0902 Last data filed at 02/07/12  0600  Gross per 24 hour  Intake 1402.83 ml  Output   2750 ml  Net -1347.17 ml    Labs:  Basename 02/07/12 0640  WBC 9.1  HGB 14.2  HCT 43.6  MCV 89.9  PLT 175    Radiology/Studies:   02/06/12 - Cardiac Cath Hemodynamics:  AO 114/61  LV 114/21  Coronary angiography:  Coronary dominance: right  Left mainstem: The left main is patent with mild tapering at the distal vessel but no more than 20% stenosis is noted. The left main divides into the LAD and left circumflex.  Left anterior descending (LAD): The LAD has critical stenosis in the mid vessel. The proximal vessel has mild luminal irregularity until the first septal perforator. Just beyond the first perforator there is 95-99% stenosis present. There is a dilated segment just at the origin of the second diagonal branch. The LAD is segmentally diseased in the mid vessel with 90% stenosis beyond the diagonal origin. The diagonal branch has no obstructive disease present. The distal LAD has diffuse nonobstructive disease.  Left circumflex (LCx): The left circumflex is patent in the proximal aspect. The mid vessel has sequential high-grade lesions with the first lesion having 75-80% stenosis and the second lesion with at least 80% stenosis leading into a large second obtuse marginal branch. The second OM also has a high-grade lesion in the distal portion of that vessel that is only appreciated in the cranial views.  Right coronary artery (RCA): The right coronary artery is a large,  dominant vessel. The vessel is diffusely diseased with no high-grade obstruction through the proximal and midportion. At the junction of the mid and distal RCA there is a 60% smooth stenosis present. The vessel gives off a large PDA branch with diffuse disease in the 50-70% range throughout the proximal PDA. The posterolateral branch is occluded and the stent is not visible. The posterolateral branch fills from left to right collaterals.  Left ventriculography: Left  ventricular systolic function is normal, LVEF is estimated at 55-65%, there is no significant mitral regurgitation  Final Conclusions:  1. Severe three-vessel coronary artery disease with critical stenosis in the mid LAD, severe stenosis of the left circumflex, and total occlusion of a small branch of the right coronary artery with moderate disease in the remaining portions of the RCA.  2. Preserved LV function  Recommendations: The patient has a large amount of myocardium in jeopardy. He is coronary disease is anatomically amenable to PCI. A large diagonal branch would have to be crossed with a stent in the mid LAD, but the ostium of the diagonal is widely patent and I suspect there would be no significant residual stenosis after treating that area. However, the patient would require at least 3 stents (1 in the mid LAD, 2 in the left circumflex). In this patient who has developed type 2 diabetes and has shown no pattern of medical compliance as he continues to smoke and does not follow a diet, I'm not sure that percutaneous treatment is his best therapy. I am going to request consultation from cardiac surgery for consideration of coronary bypass. If he is deemed not to be a good candidate because of his morbid obesity, I will proceed with PCI later this week. In the interim, will hold his Plavix since surgery is being considered. He will be started on IV heparin. All of this was discussed with the patient and his family in detail.  02/06/2012 - Orthopantogram  Findings: Multiple missing teeth are noted. Numerous caries are identified in scattered upper and lower teeth. Periapical lucency/abscesses identified of tooth #3 and what appears to be tooth #20.  IMPRESSION: Multiple dental caries and periapical abscesses as described.  02/06/12 - Carotid Doppler Carotid Findings: No significant ICA stenosis bilaterally. Vertebrals antegrade bilaterally   Assessment and Plan  66 y.o. male w/ PMHx significant for  morbid obesity, tobacco abuse, HTN, HLD, DMII, and CAD s/p PTCA/Stent in [facility] '06 (on DAPT) who had an abnormal stress and subsequent cardiac cath on 02/06/12 revealing 3V disease.  1. CAD: He has h/o PTCA/stent in '06 and has been on DAPT w/ Plavix. He has had ongoing chest pain for one year. He presented to [facility] after an abnormal stress and underwent cardiac cath on 02/06/12 revealing severe 3V disease, EF 55-65%. [facility] evaluated him and felt he would benefit from revascularization w/ CABG after Plavix washout. Last Plavix dose 02/05/12. In addition, his orthopantogram showed multiple dental caries & periapical abscesses for which he will need a dental evaluation and possible intervention before CABG. The patient would like to go home in the interim as he has social situation he needs to deal with ASAP. Dr. Clifton James discussed with Dr. Dorris Fetch this morning. Awaiting dental consult Cont Heparin drip, ASA, BB, ACEI, Lasix, Statin, PRN NTG.   2. Poor Dentition: Orthopantogram 02/06/12 showed multiple dental caries & periapical abscesses for which he will need a dental evaluation and possible intervention before CABG. Awaiting return call from dental service. Last Plavix dose 02/05/12. On  ASA.  3. Tobacco Abuse: Counseled by smoking cessation counselor. Would like to try a nicotine patch. Will order 14mg  patch per their recs   Signed, HOPE, JESSICA PA-C  I have personally seen and examined this patient with [facility], PA-C. I agree with the assessment and plan as outlined above. He needs CABG but has been on Plavix so will need washout for 5-7 days. He has no chest pain. He has dental caries and will ultimately need extraction. Dr. Ileene Hutchinson and Dr. Dorris Fetch to make plans regarding CABG and dental work next week. Pt wishes to leave today. We have told him how we would prefer that he stay hear but he will not stay. He will keep his f/u next week for CABG.   [name] 1:09  PM 02/07/2012

## 2012-02-07 NOTE — Progress Notes (Signed)
ANTICOAGULATION CONSULT NOTE - Follow Up  Pharmacy Consult for Heparin Indication: chest pain/ACS  Allergies  Allergen Reactions  . Ciprofloxacin Hcl Other (See Comments)    Fluoroquinolones:difficulty swallowing and Sore mouth    Patient Measurements: Height: 5\' 6"  (167.6 cm) Weight: 295 lb 6.7 oz (134 kg) IBW/kg (Calculated) : 63.8  Heparin Dosing Weight: 95.7  Vital Signs: Temp: 97.7 F (36.5 C) (03/26 0700) Temp src: Oral (03/26 0700) BP: 148/68 mmHg (03/26 0700) Pulse Rate: 56  (03/26 0700)  Labs:  Basename 02/07/12 0640 02/07/12 0019  HGB 14.2 --  HCT 43.6 --  PLT 175 --  APTT -- --  LABPROT -- --  INR -- --  HEPARINUNFRC 0.60 0.22*  CREATININE -- --  CKTOTAL -- --  CKMB -- --  TROPONINI -- --   CrCl is unknown because no creatinine reading has been taken.  Medical History: Past Medical History  Diagnosis Date  . Other malaise and fatigue   . Swelling of limb   . Edema   . Osteoarthrosis, unspecified whether generalized or localized, unspecified site   . Congestive heart failure, unspecified   . CAD (coronary artery disease), native coronary artery 2006    Stent placed in 2006 in [place], [place]  . Pure hypercholesterolemia   . Hypertension   . Morbid obesity   . Tobacco abuse   . Myocardial infarction   . Shortness of breath   . Diabetes mellitus     Assessment: 66 yo M admitted after outpatient cath demonstrated severe 3V CAD and plan for OHS on 02/13/12.  Heparin level therapeutic at 0.6.  No bleeding noted.   Goal of Therapy:  Heparin level 0.3-0.7 units/ml   Plan:  1. Continue heparin at 1550 units/hr.  2. Heparin level in 6 hours (confirmatory level) 3. Continue daily CBC, heparin level   Rolland Porter, Pharm.D., BCPS Clinical Pharmacist Pager: [phone]

## 2012-02-07 NOTE — Discharge Summary (Signed)
Discharge Summary   Patient ID: [name] MRN: [id number], DOB/AGE: [birth year] 66 y.o.  Primary MD: Kirstie Peri, MD Primary Cardiologist: Lorine Bears, MD  Admit date: 02/06/2012 D/C date:     02/07/2012     Primary Discharge Diagnoses:  1. CAD  - s/p PTCA/stent in [place], [place] '06  - Cardiac cath 3/25 revealed severe 3V CAD, EF 55-65%  - CABG scheduled for 02/21/12  2. Dental caries  - Orthopantogram revealed multiple dental caries & periapical abscesses  - Dr. Ileene Hutchinson evaluated and recommended oral Chlorhexidine rinses BID for 3 mos and outpatient f/u after CABG for extractions  Secondary Discharge Diagnoses:  1. Diabetes Mellitus, Type 2 2. HTN 3. HLD 4. Tobacco Abuse 5. Morbid Obesity 6. Diastolic CHF 7. Osteoarthritis  Allergies Allergies  Allergen Reactions  . Ciprofloxacin Hcl Other (See Comments)    Fluoroquinolones:difficulty swallowing and Sore mouth    Diagnostic Studies/Procedures:   02/06/12 - Cardiac Cath  Hemodynamics:  AO 114/61  LV 114/21  Coronary angiography:  Coronary dominance: right  Left mainstem: The left main is patent with mild tapering at the distal vessel but no more than 20% stenosis is noted. The left main divides into the LAD and left circumflex.  Left anterior descending (LAD): The LAD has critical stenosis in the mid vessel. The proximal vessel has mild luminal irregularity until the first septal perforator. Just beyond the first perforator there is 95-99% stenosis present. There is a dilated segment just at the origin of the second diagonal branch. The LAD is segmentally diseased in the mid vessel with 90% stenosis beyond the diagonal origin. The diagonal branch has no obstructive disease present. The distal LAD has diffuse nonobstructive disease.  Left circumflex (LCx): The left circumflex is patent in the proximal aspect. The mid vessel has sequential high-grade lesions with the first lesion having 75-80% stenosis and the  second lesion with at least 80% stenosis leading into a large second obtuse marginal branch. The second OM also has a high-grade lesion in the distal portion of that vessel that is only appreciated in the cranial views.  Right coronary artery (RCA): The right coronary artery is a large, dominant vessel. The vessel is diffusely diseased with no high-grade obstruction through the proximal and midportion. At the junction of the mid and distal RCA there is a 60% smooth stenosis present. The vessel gives off a large PDA branch with diffuse disease in the 50-70% range throughout the proximal PDA. The posterolateral branch is occluded and the stent is not visible. The posterolateral branch fills from left to right collaterals.  Left ventriculography: Left ventricular systolic function is normal, LVEF is estimated at 55-65%, there is no significant mitral regurgitation  Final Conclusions:  1. Severe three-vessel coronary artery disease with critical stenosis in the mid LAD, severe stenosis of the left circumflex, and total occlusion of a small branch of the right coronary artery with moderate disease in the remaining portions of the RCA.  2. Preserved LV function  Recommendations: The patient has a large amount of myocardium in jeopardy. He is coronary disease is anatomically amenable to PCI. A large diagonal branch would have to be crossed with a stent in the mid LAD, but the ostium of the diagonal is widely patent and I suspect there would be no significant residual stenosis after treating that area. However, the patient would require at least 3 stents (1 in the mid LAD, 2 in the left circumflex). In this patient who has developed  type 2 diabetes and has shown no pattern of medical compliance as he continues to smoke and does not follow a diet, I'm not sure that percutaneous treatment is his best therapy. I am going to request consultation from cardiac surgery for consideration of coronary bypass. If he is deemed  not to be a good candidate because of his morbid obesity, I will proceed with PCI later this week. In the interim, will hold his Plavix since surgery is being considered. He will be started on IV heparin. All of this was discussed with the patient and his family in detail.   02/06/2012 - Orthopantogram  Findings: Multiple missing teeth are noted. Numerous caries are identified in scattered upper and lower teeth. Periapical lucency/abscesses identified of tooth #3 and what appears to be tooth #20. IMPRESSION: Multiple dental caries and periapical abscesses as described.   02/06/12 - Carotid Doppler  Carotid Findings: No significant ICA stenosis bilaterally. Vertebrals antegrade bilaterally  History of Present Illness: 66 y.o. male w/ PMHx significant for morbid obesity, tobacco abuse, HTN, HLD, DMII, and CAD s/p PTCA/Stent in [facility] '06 (on DAPT) who had an abnormal stress and subsequent cardiac cath on 02/06/12 revealing 3V disease.  He has known history of coronary artery disease status post angioplasty and stent placement in 2006 in [place], [place]. He has not had any cardiac events since then and has been on dual antiplatelet therapy since that time. He recently went to a regular check up with his primary care physician Dr. Sherryll Burger at which time he complained of lower extremity edema as well as progressive chest discomfort. He had a lower extremity Doppler which showed no evidence of DVT. The patient reported progressive substernal chest tightness only with activities that started about a year ago and has been progressive since then. He underwent a nuclear stress test on 01/27/12 during which he had 1 mm of ST depression in the inferior and inferior lateral leads associated with chest tightness and few PVCs. His nuclear scan showed evidence of 2 vessel ischemia in the LAD as well as left circumflex distributions with normal ejection fraction. He was then sent to Dr. Jari Sportsman office for further evaluation at  which time he recommended proceeding with an urgent cardiac catheterization, but the patient requested it be done later due to social reasons and this was scheduled for 02/06/12.   Hospital Course: He presented to [facility] on 02/06/12 and underwent cardiac catheterization revealing severe 3V disease, EF 55-65%. He tolerated the procedure well without complications. [facility] evaluated him and felt he would benefit from revascularization w/ CABG after a Plavix washout (last dose 02/05/12). It was recommended he stay in the hospital until the surgery for close observation given his severe disease, but he wanted to go home to deal with social issues. Dr. Dorris Fetch initially scheduled his CABG for Monday April 1st, but the patient said he could not come back until April 8th. [facility] aware and will contact the patient for further instructions with surgery currently scheduled for April 9th. During this hospitalization an orthopantogram revealed multiple dental caries & periapical abscesses. Dr. Ileene Hutchinson (dentist) was notified and discussed case with Dr. Dorris Fetch and decided he could go home with oral chlorhexidine rinses and have dental extraction post CABG. This will need to be arranged when he returns for his CABG.  Carotid doppler was performed revealing no significant ICA stenosis bilaterally. He received tobacco cessation counseling and was started on nicotine patches per their recommendations.   He was seen and  evaluated by Dr. Clifton James who discussed with the patient the risk of leaving the hospital before CABG surgery and the patient stated understanding. He was discharged home in stable condition.  Discharge Vitals: Blood pressure 148/68, pulse 56, temperature 97.7 F (36.5 C), temperature source Oral, resp. rate 16, height 5\' 6"  (1.676 m), weight 295 lb 6.7 oz (134 kg), SpO2 99.00%.  Labs: Component Value Date   WBC 9.1 02/07/2012   HGB 14.2 02/07/2012   HCT 43.6 02/07/2012   MCV 89.9 02/07/2012    PLT 175 02/07/2012    Discharge Medications   Medication List  As of 02/07/2012  2:21 PM   STOP taking these medications         clopidogrel 75 MG tablet         TAKE these medications         aspirin EC 81 MG tablet   Take 81 mg by mouth daily.      atorvastatin 40 MG tablet   Commonly known as: LIPITOR   Take 40 mg by mouth daily.      chlorhexidine 0.12 % solution   Commonly known as: PERIDEX   Use as directed 15 mLs in the mouth or throat 2 (two) times daily. Use after breakfast and before bed daily      colchicine 0.6 MG tablet   Take 0.6 mg by mouth 2 (two) times daily as needed. For gout flare      furosemide 40 MG tablet   Commonly known as: LASIX   Take 40 mg by mouth daily.      lisinopril 10 MG tablet   Commonly known as: PRINIVIL,ZESTRIL   Take 10 mg by mouth daily.      metoprolol 50 MG tablet   Commonly known as: LOPRESSOR   Take 25 mg by mouth daily.      nicotine 14 mg/24hr patch   Commonly known as: NICODERM CQ - dosed in mg/24 hours   Place 1 patch onto the skin daily. 14mg  patch daily x 2 wks, then 7mg  patch daily x 2 wks      nitroGLYCERIN 0.4 MG SL tablet   Commonly known as: NITROSTAT   Place 0.4 mg under the tongue every 5 (five) minutes as needed. For chest pain            Disposition   Discharge Orders    Future Orders Please Complete By Expires   Diet - low sodium heart healthy      Increase activity slowly      Discharge instructions      Comments:   Please follow instructions provided by Dr. Dorris Fetch  Use chlorhexidine oral rinse twice a day. You will need to follow up as an outpatient  with dentist after CABG  * KEEP WRIST CATHETERIZATION SITE CLEAN AND DRY. Call the office for any signs of bleeding, pus, swelling, increased pain, or any other concerns. * NO HEAVY LIFTING (>10lbs) X 2 WEEKS. * NO SEXUAL ACTIVITY X 2 WEEKS. * NO DRIVING X 1 WEEK. * NO SOAKING BATHS, HOT TUBS, POOLS, ETC., X 7 DAYS.       Follow-up Information    Follow up with Loreli Slot, MD.   Contact information:   Cardiothoracic Surgery [address] [place] [address] [phone]       Follow up with Lorine Bears, MD.   Contact information:   [address] [place] [place] [address] [phone]  Outstanding Labs/Studies:  Pre-CABG evaluation per [facility]  Duration of Discharge Encounter: Greater than 30 minutes including physician and PA time.  Signed, Ascencion Coye PA-C 02/07/2012, 2:21 PM

## 2012-02-08 ENCOUNTER — Other Ambulatory Visit: Payer: Self-pay

## 2012-02-08 DIAGNOSIS — I2510 Atherosclerotic heart disease of native coronary artery without angina pectoris: Secondary | ICD-10-CM

## 2012-02-10 ENCOUNTER — Encounter (HOSPITAL_COMMUNITY): Payer: Self-pay | Admitting: Pharmacy Technician

## 2012-02-17 NOTE — Pre-Procedure Instructions (Signed)
20 [name]  02/17/2012   Your procedure is scheduled on:  Wednesday February 22, 2012 at 0830 am.  Report to [facility] at 0630 AM.  Call this number if you have problems the morning of surgery: [phone]   Remember:   Do not eat food:After Midnight.  May have clear liquids: up to 4 Hours before arrival until 0230 am  Clear liquids include soda, tea, black coffee, apple or grape juice, broth.  Take these medicines the morning of surgery with A SIP OF WATER: Metoprolol (Lopressor)   Do not wear jewelry, make-up or nail polish.  Do not wear lotions, powders, or perfumes. You may wear deodorant.  Do not shave 48 hours prior to surgery.  Do not bring valuables to the hospital.  Contacts, dentures or bridgework may not be worn into surgery.  Leave suitcase in the car. After surgery it may be brought to your room.  For patients admitted to the hospital, checkout time is 11:00 AM the day of discharge.   Patients discharged the day of surgery will not be allowed to drive home.  Name and phone number of your driver:   Special Instructions: CHG Shower Use Special Wash: 1/2 bottle night before surgery and 1/2 bottle morning of surgery.   Please read over the following fact sheets that you were given: Pain Booklet, Coughing and Deep Breathing, Blood Transfusion Information, Open Heart Packet and Surgical Site Infection Prevention

## 2012-02-20 ENCOUNTER — Encounter (HOSPITAL_COMMUNITY)
Admission: RE | Admit: 2012-02-20 | Discharge: 2012-02-20 | Disposition: A | Discharge status: Home / Self Care | Payer: Medicare Other | Source: Ambulatory Visit | Attending: Thoracic Surgery (Cardiothoracic Vascular Surgery) | Admitting: Thoracic Surgery (Cardiothoracic Vascular Surgery)

## 2012-02-20 ENCOUNTER — Encounter (HOSPITAL_COMMUNITY): Payer: Self-pay

## 2012-02-20 VITALS — BP 131/92 | HR 58 | Temp 97.9°F | Resp 18 | Ht 66.0 in | Wt 294.0 lb

## 2012-02-20 DIAGNOSIS — I2510 Atherosclerotic heart disease of native coronary artery without angina pectoris: Secondary | ICD-10-CM

## 2012-02-20 LAB — BLOOD GAS, ARTERIAL
Bicarbonate: 22.6 mEq/L (ref 20.0–24.0)
O2 Saturation: 93.8 %
Patient temperature: 98.6
TCO2: 23.7 mmol/L (ref 0–100)
pH, Arterial: 7.423 (ref 7.350–7.450)
pO2, Arterial: 62.1 mmHg — ABNORMAL LOW (ref 80.0–100.0)

## 2012-02-20 LAB — CBC
HCT: 45 % (ref 39.0–52.0)
Hemoglobin: 14.4 g/dL (ref 13.0–17.0)
MCHC: 32 g/dL (ref 30.0–36.0)
RBC: 4.98 MIL/uL (ref 4.22–5.81)

## 2012-02-20 LAB — COMPREHENSIVE METABOLIC PANEL
ALT: 23 U/L (ref 0–53)
Alkaline Phosphatase: 97 U/L (ref 39–117)
BUN: 15 mg/dL (ref 6–23)
CO2: 28 mEq/L (ref 19–32)
Chloride: 102 mEq/L (ref 96–112)
GFR calc Af Amer: 68 mL/min — ABNORMAL LOW (ref >90)
GFR calc non Af Amer: 59 mL/min — ABNORMAL LOW (ref >90)
Glucose, Bld: 108 mg/dL — ABNORMAL HIGH (ref 70–99)
Potassium: 4.6 mEq/L (ref 3.5–5.1)
Sodium: 138 mEq/L (ref 135–145)
Total Bilirubin: 0.4 mg/dL (ref 0.3–1.2)
Total Protein: 7.4 g/dL (ref 6.0–8.3)

## 2012-02-20 LAB — ABO/RH: ABO/RH(D): B POS

## 2012-02-20 LAB — URINALYSIS, ROUTINE W REFLEX MICROSCOPIC
Glucose, UA: NEGATIVE mg/dL
Ketones, ur: NEGATIVE mg/dL
Leukocytes, UA: NEGATIVE
Nitrite: NEGATIVE
Specific Gravity, Urine: 1.02 (ref 1.005–1.030)
pH: 5.5 (ref 5.0–8.0)

## 2012-02-20 LAB — TYPE AND SCREEN

## 2012-02-20 LAB — PROTIME-INR: INR: 0.99 (ref 0.00–1.49)

## 2012-02-20 LAB — HEMOGLOBIN A1C: Hgb A1c MFr Bld: 6.4 % — ABNORMAL HIGH (ref <5.7)

## 2012-02-20 LAB — SURGICAL PCR SCREEN: MRSA, PCR: NEGATIVE

## 2012-02-20 NOTE — Pre-Procedure Instructions (Signed)
20 [name]  02/20/2012   Your procedure is scheduled on:  Wednesday  02/22/12   Report to [facility] at 630 AM.  Call this number if you have problems the morning of surgery: [phone]   Remember:   Do not eat food:After Midnight.  May have clear liquids: up to 4 Hours before arrival.  Clear liquids include soda, tea, black coffee, apple or grape juice, broth.  Take these medicines the morning of surgery with A SIP OF WATER: COLCHICINE,  LOPRESSOR(METOPROLOL)   Do not wear jewelry, make-up or nail polish.  Do not wear lotions, powders, or perfumes. You may wear deodorant.  Do not shave 48 hours prior to surgery.  Do not bring valuables to the hospital.  Contacts, dentures or bridgework may not be worn into surgery.  Leave suitcase in the car. After surgery it may be brought to your room.  For patients admitted to the hospital, checkout time is 11:00 AM the day of discharge.   Patients discharged the day of surgery will not be allowed to drive home.  Name and phone number of your driver:   Special Instructions: CHG Shower Use Special Wash: 1/2 bottle night before surgery and 1/2 bottle morning of surgery.   Please read over the following fact sheets that you were given: Pain Booklet, Coughing and Deep Breathing, Blood Transfusion Information, Open Heart Packet, MRSA Information and Surgical Site Infection Prevention

## 2012-02-21 MED ORDER — POTASSIUM CHLORIDE 2 MEQ/ML IV SOLN
80.0000 meq | INTRAVENOUS | Status: DC
  Filled 2012-02-21: qty 40

## 2012-02-21 MED ORDER — MAGNESIUM SULFATE 50 % IJ SOLN
40.0000 meq | INTRAMUSCULAR | Status: DC
  Filled 2012-02-21: qty 10

## 2012-02-21 MED ORDER — TRANEXAMIC ACID 100 MG/ML IV SOLN
1.5000 mg/kg/h | INTRAVENOUS | Status: AC
  Administered 2012-02-22: 1.5 mg/kg/h via INTRAVENOUS
  Filled 2012-02-21: qty 25

## 2012-02-21 MED ORDER — VERAPAMIL HCL 2.5 MG/ML IV SOLN
INTRAVENOUS | Status: AC
  Administered 2012-02-22: 10:00:00
  Filled 2012-02-21: qty 2.5

## 2012-02-21 MED ORDER — SODIUM CHLORIDE 0.9 % IV SOLN
INTRAVENOUS | Status: DC
  Filled 2012-02-21: qty 40

## 2012-02-21 MED ORDER — DEXTROSE 5 % IV SOLN
1.5000 g | INTRAVENOUS | Status: AC
  Administered 2012-02-22: .75 g via INTRAVENOUS
  Administered 2012-02-22: 1.5 g via INTRAVENOUS
  Filled 2012-02-21: qty 1.5

## 2012-02-21 MED ORDER — VANCOMYCIN HCL 1000 MG IV SOLR
1500.0000 mg | INTRAVENOUS | Status: AC
  Administered 2012-02-22: 1500 mg via INTRAVENOUS
  Filled 2012-02-21: qty 1500

## 2012-02-21 MED ORDER — NITROGLYCERIN IN D5W 200-5 MCG/ML-% IV SOLN
2.0000 ug/min | INTRAVENOUS | Status: AC
  Administered 2012-02-22: 5 ug/min via INTRAVENOUS
  Filled 2012-02-21: qty 250

## 2012-02-21 MED ORDER — DOPAMINE-DEXTROSE 3.2-5 MG/ML-% IV SOLN
2.0000 ug/kg/min | INTRAVENOUS | Status: DC
  Filled 2012-02-21: qty 250

## 2012-02-21 MED ORDER — METOPROLOL TARTRATE 12.5 MG HALF TABLET: 12.5000 mg | ORAL_TABLET | Freq: Once | ORAL | Status: DC

## 2012-02-21 MED ORDER — SODIUM CHLORIDE 0.9 % IV SOLN
INTRAVENOUS | Status: AC
  Administered 2012-02-22: 1 [IU]/h via INTRAVENOUS
  Filled 2012-02-21: qty 1

## 2012-02-21 MED ORDER — DEXTROSE 5 % IV SOLN
750.0000 mg | INTRAVENOUS | Status: DC
  Filled 2012-02-21: qty 750

## 2012-02-21 MED ORDER — EPINEPHRINE HCL 1 MG/ML IJ SOLN
0.5000 ug/min | INTRAMUSCULAR | Status: DC
  Filled 2012-02-21: qty 4

## 2012-02-21 MED ORDER — DEXTROSE 5 % IV SOLN
30.0000 ug/min | INTRAVENOUS | Status: DC
  Filled 2012-02-21: qty 2

## 2012-02-21 MED ORDER — SODIUM CHLORIDE 0.9 % IV SOLN
0.1000 ug/kg/h | INTRAVENOUS | Status: AC
  Administered 2012-02-22: .2 ug/kg/h via INTRAVENOUS
  Filled 2012-02-21: qty 4

## 2012-02-21 MED ORDER — TRANEXAMIC ACID (OHS) PUMP PRIME SOLUTION
2.0000 mg/kg | INTRAVENOUS | Status: AC
  Filled 2012-02-21 (×2): qty 2.67

## 2012-02-22 ENCOUNTER — Encounter (HOSPITAL_COMMUNITY): Payer: Self-pay | Admitting: *Deleted

## 2012-02-22 ENCOUNTER — Encounter (HOSPITAL_COMMUNITY): Payer: Self-pay | Admitting: Certified Registered"

## 2012-02-22 ENCOUNTER — Ambulatory Visit (HOSPITAL_COMMUNITY): Payer: Medicare Other | Admitting: Certified Registered"

## 2012-02-22 ENCOUNTER — Encounter (HOSPITAL_COMMUNITY)
Admission: RE | Disposition: A | Discharge status: Skilled Nursing Facility | Payer: Self-pay | Source: Ambulatory Visit | Attending: Thoracic Surgery (Cardiothoracic Vascular Surgery)

## 2012-02-22 ENCOUNTER — Other Ambulatory Visit: Payer: Self-pay

## 2012-02-22 ENCOUNTER — Inpatient Hospital Stay (HOSPITAL_COMMUNITY): Payer: Medicare Other

## 2012-02-22 ENCOUNTER — Inpatient Hospital Stay (HOSPITAL_COMMUNITY)
Admission: RE | Admit: 2012-02-22 | Discharge: 2012-03-02 | DRG: 235 | Disposition: A | Discharge status: Skilled Nursing Facility | Payer: Medicare Other | Source: Ambulatory Visit | Attending: Thoracic Surgery (Cardiothoracic Vascular Surgery) | Admitting: Thoracic Surgery (Cardiothoracic Vascular Surgery)

## 2012-02-22 DIAGNOSIS — Z6841 Body Mass Index (BMI) 40.0 and over, adult: Secondary | ICD-10-CM

## 2012-02-22 DIAGNOSIS — M109 Gout, unspecified: Secondary | ICD-10-CM | POA: Diagnosis present

## 2012-02-22 DIAGNOSIS — Z01812 Encounter for preprocedural laboratory examination: Secondary | ICD-10-CM

## 2012-02-22 DIAGNOSIS — Y9210 Unspecified residential institution as the place of occurrence of the external cause: Secondary | ICD-10-CM

## 2012-02-22 DIAGNOSIS — N289 Disorder of kidney and ureter, unspecified: Secondary | ICD-10-CM | POA: Diagnosis not present

## 2012-02-22 DIAGNOSIS — I252 Old myocardial infarction: Secondary | ICD-10-CM

## 2012-02-22 DIAGNOSIS — F17200 Nicotine dependence, unspecified, uncomplicated: Secondary | ICD-10-CM | POA: Diagnosis present

## 2012-02-22 DIAGNOSIS — E7800 Pure hypercholesterolemia, unspecified: Secondary | ICD-10-CM | POA: Diagnosis present

## 2012-02-22 DIAGNOSIS — I509 Heart failure, unspecified: Secondary | ICD-10-CM | POA: Diagnosis present

## 2012-02-22 DIAGNOSIS — I2510 Atherosclerotic heart disease of native coronary artery without angina pectoris: Secondary | ICD-10-CM

## 2012-02-22 DIAGNOSIS — Y832 Surgical operation with anastomosis, bypass or graft as the cause of abnormal reaction of the patient, or of later complication, without mention of misadventure at the time of the procedure: Secondary | ICD-10-CM | POA: Diagnosis not present

## 2012-02-22 DIAGNOSIS — I10 Essential (primary) hypertension: Secondary | ICD-10-CM | POA: Diagnosis present

## 2012-02-22 DIAGNOSIS — R5381 Other malaise: Secondary | ICD-10-CM | POA: Diagnosis not present

## 2012-02-22 DIAGNOSIS — I872 Venous insufficiency (chronic) (peripheral): Secondary | ICD-10-CM | POA: Diagnosis present

## 2012-02-22 DIAGNOSIS — R6510 Systemic inflammatory response syndrome (SIRS) of non-infectious origin without acute organ dysfunction: Secondary | ICD-10-CM | POA: Diagnosis not present

## 2012-02-22 DIAGNOSIS — D696 Thrombocytopenia, unspecified: Secondary | ICD-10-CM | POA: Diagnosis not present

## 2012-02-22 DIAGNOSIS — I519 Heart disease, unspecified: Secondary | ICD-10-CM | POA: Diagnosis not present

## 2012-02-22 DIAGNOSIS — Z951 Presence of aortocoronary bypass graft: Secondary | ICD-10-CM

## 2012-02-22 DIAGNOSIS — E119 Type 2 diabetes mellitus without complications: Secondary | ICD-10-CM | POA: Diagnosis present

## 2012-02-22 DIAGNOSIS — I4891 Unspecified atrial fibrillation: Secondary | ICD-10-CM | POA: Diagnosis not present

## 2012-02-22 DIAGNOSIS — N170 Acute kidney failure with tubular necrosis: Secondary | ICD-10-CM | POA: Diagnosis not present

## 2012-02-22 DIAGNOSIS — Z79899 Other long term (current) drug therapy: Secondary | ICD-10-CM

## 2012-02-22 DIAGNOSIS — Z9861 Coronary angioplasty status: Secondary | ICD-10-CM

## 2012-02-22 DIAGNOSIS — D62 Acute posthemorrhagic anemia: Secondary | ICD-10-CM | POA: Diagnosis not present

## 2012-02-22 DIAGNOSIS — Z7982 Long term (current) use of aspirin: Secondary | ICD-10-CM

## 2012-02-22 HISTORY — PX: CORONARY ARTERY BYPASS GRAFT: SHX141

## 2012-02-22 LAB — POCT I-STAT 4, (NA,K, GLUC, HGB,HCT)
Glucose, Bld: 146 mg/dL — ABNORMAL HIGH (ref 70–99)
Glucose, Bld: 147 mg/dL — ABNORMAL HIGH (ref 70–99)
Glucose, Bld: 158 mg/dL — ABNORMAL HIGH (ref 70–99)
HCT: 29 % — ABNORMAL LOW (ref 39.0–52.0)
HCT: 30 % — ABNORMAL LOW (ref 39.0–52.0)
Hemoglobin: 13.9 g/dL (ref 13.0–17.0)
Hemoglobin: 13.3 g/dL (ref 13.0–17.0)
Hemoglobin: 9.9 g/dL — ABNORMAL LOW (ref 13.0–17.0)
Hemoglobin: 9.9 g/dL — ABNORMAL LOW (ref 13.0–17.0)
Hemoglobin: 13.3 g/dL (ref 13.0–17.0)
Potassium: 4.8 mEq/L (ref 3.5–5.1)
Potassium: 6.5 mEq/L (ref 3.5–5.1)
Potassium: 5 mEq/L (ref 3.5–5.1)
Potassium: 4.8 mEq/L (ref 3.5–5.1)
Sodium: 137 mEq/L (ref 135–145)
Sodium: 135 mEq/L (ref 135–145)
Sodium: 133 mEq/L — ABNORMAL LOW (ref 135–145)
Sodium: 136 mEq/L (ref 135–145)

## 2012-02-22 LAB — POCT I-STAT 3, ART BLOOD GAS (G3+)
Acid-base deficit: 1 mmol/L (ref 0.0–2.0)
Acid-base deficit: 4 mmol/L — ABNORMAL HIGH (ref 0.0–2.0)
Acid-base deficit: 6 mmol/L — ABNORMAL HIGH (ref 0.0–2.0)
Acid-base deficit: 1 mmol/L (ref 0.0–2.0)
Bicarbonate: 27.1 mEq/L — ABNORMAL HIGH (ref 20.0–24.0)
Bicarbonate: 22.4 mEq/L (ref 20.0–24.0)
Bicarbonate: 26.6 mEq/L — ABNORMAL HIGH (ref 20.0–24.0)
Bicarbonate: 24.7 mEq/L — ABNORMAL HIGH (ref 20.0–24.0)
O2 Saturation: 100 %
O2 Saturation: 99 %
O2 Saturation: 96 %
Patient temperature: 35.8
Patient temperature: 36.1
TCO2: 29 mmol/L (ref 0–100)
TCO2: 24 mmol/L (ref 0–100)
TCO2: 28 mmol/L (ref 0–100)
pCO2 arterial: 45.6 mmHg — ABNORMAL HIGH (ref 35.0–45.0)
pH, Arterial: 7.357 (ref 7.350–7.450)
pO2, Arterial: 80 mmHg (ref 80.0–100.0)
pO2, Arterial: 94 mmHg (ref 80.0–100.0)

## 2012-02-22 LAB — POCT I-STAT, CHEM 8
Calcium, Ion: 1.09 mmol/L — ABNORMAL LOW (ref 1.12–1.32)
Creatinine, Ser: 1.3 mg/dL (ref 0.50–1.35)
Glucose, Bld: 133 mg/dL — ABNORMAL HIGH (ref 70–99)
HCT: 39 % (ref 39.0–52.0)
Hemoglobin: 13.3 g/dL (ref 13.0–17.0)
Potassium: 5.9 mEq/L — ABNORMAL HIGH (ref 3.5–5.1)
TCO2: 25 mmol/L (ref 0–100)

## 2012-02-22 LAB — PROTIME-INR: Prothrombin Time: 17.4 seconds — ABNORMAL HIGH (ref 11.6–15.2)

## 2012-02-22 LAB — GLUCOSE, CAPILLARY
Glucose-Capillary: 138 mg/dL — ABNORMAL HIGH (ref 70–99)
Glucose-Capillary: 141 mg/dL — ABNORMAL HIGH (ref 70–99)
Glucose-Capillary: 116 mg/dL — ABNORMAL HIGH (ref 70–99)
Glucose-Capillary: 111 mg/dL — ABNORMAL HIGH (ref 70–99)
Glucose-Capillary: 127 mg/dL — ABNORMAL HIGH (ref 70–99)

## 2012-02-22 LAB — CBC
HCT: 40 % (ref 39.0–52.0)
MCH: 29.2 pg (ref 26.0–34.0)
MCHC: 32.8 g/dL (ref 30.0–36.0)
MCV: 89.3 fL (ref 78.0–100.0)
Platelets: 117 10*3/uL — ABNORMAL LOW (ref 150–400)
RBC: 4.41 MIL/uL (ref 4.22–5.81)
RDW: 13.8 % (ref 11.5–15.5)
WBC: 24.3 10*3/uL — ABNORMAL HIGH (ref 4.0–10.5)

## 2012-02-22 LAB — POCT I-STAT GLUCOSE: Operator id: 284731

## 2012-02-22 LAB — HEMOGLOBIN AND HEMATOCRIT, BLOOD: Hemoglobin: 9.9 g/dL — ABNORMAL LOW (ref 13.0–17.0)

## 2012-02-22 LAB — APTT: aPTT: 30 seconds (ref 24–37)

## 2012-02-22 SURGERY — CORONARY ARTERY BYPASS GRAFTING (CABG)
Anesthesia: General | Site: Chest | Wound class: Clean

## 2012-02-22 MED ORDER — PHENYLEPHRINE HCL 10 MG/ML IJ SOLN
0.0000 ug/min | INTRAVENOUS | Status: DC
  Filled 2012-02-22: qty 2

## 2012-02-22 MED ORDER — TRANEXAMIC ACID (OHS) BOLUS VIA INFUSION
15.0000 mg/kg | INTRAVENOUS | Status: DC
  Filled 2012-02-22: qty 2001

## 2012-02-22 MED ORDER — SODIUM BICARBONATE 8.4 % IV SOLN
INTRAVENOUS | Status: AC
  Administered 2012-02-22: 100 meq via INTRAVENOUS
  Filled 2012-02-22: qty 50

## 2012-02-22 MED ORDER — METOPROLOL TARTRATE 12.5 MG HALF TABLET
12.5000 mg | ORAL_TABLET | Freq: Two times a day (BID) | ORAL | Status: DC
  Filled 2012-02-22 (×3): qty 1

## 2012-02-22 MED ORDER — ALBUTEROL SULFATE (5 MG/ML) 0.5% IN NEBU
2.5000 mg | INHALATION_SOLUTION | Freq: Four times a day (QID) | RESPIRATORY_TRACT | Status: DC
  Administered 2012-02-22 – 2012-02-26 (×17): 2.5 mg via RESPIRATORY_TRACT
  Filled 2012-02-22 (×17): qty 0.5

## 2012-02-22 MED ORDER — PROPOFOL 10 MG/ML IV EMUL
INTRAVENOUS | Status: DC | PRN
  Administered 2012-02-22: 100 mg via INTRAVENOUS

## 2012-02-22 MED ORDER — ASPIRIN EC 325 MG PO TBEC
325.0000 mg | DELAYED_RELEASE_TABLET | Freq: Every day | ORAL | Status: DC
  Administered 2012-02-23 – 2012-03-02 (×9): 325 mg via ORAL
  Filled 2012-02-22 (×9): qty 1

## 2012-02-22 MED ORDER — SODIUM CHLORIDE 0.9 % IV SOLN
INTRAVENOUS | Status: DC | PRN
  Administered 2012-02-22: 14:00:00 via INTRAVENOUS

## 2012-02-22 MED ORDER — METOPROLOL TARTRATE 25 MG/10 ML ORAL SUSPENSION
12.5000 mg | Freq: Two times a day (BID) | ORAL | Status: DC
  Filled 2012-02-22 (×3): qty 5

## 2012-02-22 MED ORDER — BISACODYL 5 MG PO TBEC
10.0000 mg | DELAYED_RELEASE_TABLET | Freq: Every day | ORAL | Status: DC
  Administered 2012-02-23 – 2012-02-28 (×4): 10 mg via ORAL
  Filled 2012-02-22 (×5): qty 2

## 2012-02-22 MED ORDER — EPHEDRINE SULFATE 50 MG/ML IJ SOLN
INTRAMUSCULAR | Status: DC | PRN
  Administered 2012-02-22: 10 mg via INTRAVENOUS
  Administered 2012-02-22: 5 mg via INTRAVENOUS

## 2012-02-22 MED ORDER — DOCUSATE SODIUM 100 MG PO CAPS
200.0000 mg | ORAL_CAPSULE | Freq: Every day | ORAL | Status: DC
  Administered 2012-02-23 – 2012-03-02 (×5): 200 mg via ORAL
  Filled 2012-02-22 (×7): qty 2

## 2012-02-22 MED ORDER — LACTATED RINGERS IV SOLN
INTRAVENOUS | Status: DC | PRN
  Administered 2012-02-22 (×2): via INTRAVENOUS

## 2012-02-22 MED ORDER — FAMOTIDINE IN NACL 20-0.9 MG/50ML-% IV SOLN
20.0000 mg | Freq: Two times a day (BID) | INTRAVENOUS | Status: AC
  Administered 2012-02-22: 20 mg via INTRAVENOUS

## 2012-02-22 MED ORDER — CHLORHEXIDINE GLUCONATE 0.12 % MT SOLN
15.0000 mL | Freq: Two times a day (BID) | OROMUCOSAL | Status: DC
  Administered 2012-02-23: 15 mL via OROMUCOSAL

## 2012-02-22 MED ORDER — SODIUM BICARBONATE 8.4 % IV SOLN
100.0000 meq | Freq: Once | INTRAVENOUS | Status: AC
  Administered 2012-02-22: 100 meq via INTRAVENOUS
  Filled 2012-02-22: qty 100

## 2012-02-22 MED ORDER — DEXTROSE 5 % IV SOLN
1.5000 g | Freq: Two times a day (BID) | INTRAVENOUS | Status: AC
  Administered 2012-02-22 – 2012-02-24 (×4): 1.5 g via INTRAVENOUS
  Filled 2012-02-22 (×4): qty 1.5

## 2012-02-22 MED ORDER — TRANEXAMIC ACID 100 MG/ML IV SOLN
1.5000 mg/kg/h | INTRAVENOUS | Status: DC
  Filled 2012-02-22 (×2): qty 25

## 2012-02-22 MED ORDER — POTASSIUM CHLORIDE 10 MEQ/50ML IV SOLN: 10.0000 meq | INTRAVENOUS | Status: AC

## 2012-02-22 MED ORDER — ASPIRIN 81 MG PO CHEW
324.0000 mg | CHEWABLE_TABLET | Freq: Every day | ORAL | Status: DC
  Filled 2012-02-22: qty 1

## 2012-02-22 MED ORDER — HEMOSTATIC AGENTS (NO CHARGE) OPTIME
TOPICAL | Status: DC | PRN
  Administered 2012-02-22: 2 via TOPICAL

## 2012-02-22 MED ORDER — ACETAMINOPHEN 500 MG PO TABS
1000.0000 mg | ORAL_TABLET | Freq: Four times a day (QID) | ORAL | Status: AC
  Administered 2012-02-23 – 2012-02-27 (×15): 1000 mg via ORAL
  Filled 2012-02-22 (×18): qty 2

## 2012-02-22 MED ORDER — NITROGLYCERIN IN D5W 200-5 MCG/ML-% IV SOLN
0.0000 ug/min | INTRAVENOUS | Status: DC
  Administered 2012-02-22: 50 ug/min via INTRAVENOUS

## 2012-02-22 MED ORDER — PROTAMINE SULFATE 10 MG/ML IV SOLN
INTRAVENOUS | Status: DC | PRN
  Administered 2012-02-22: 40 mg via INTRAVENOUS
  Administered 2012-02-22: 10 mg via INTRAVENOUS
  Administered 2012-02-22: 100 mg via INTRAVENOUS
  Administered 2012-02-22: 200 mg via INTRAVENOUS
  Administered 2012-02-22: 50 mg via INTRAVENOUS

## 2012-02-22 MED ORDER — SODIUM CHLORIDE 0.9 % IJ SOLN: 3.0000 mL | INTRAMUSCULAR | Status: DC | PRN

## 2012-02-22 MED ORDER — MIDAZOLAM HCL 5 MG/5ML IJ SOLN
INTRAMUSCULAR | Status: DC | PRN
  Administered 2012-02-22 (×2): 3 mg via INTRAVENOUS
  Administered 2012-02-22: 5 mg via INTRAVENOUS
  Administered 2012-02-22 (×2): 2 mg via INTRAVENOUS
  Administered 2012-02-22: 3 mg via INTRAVENOUS
  Administered 2012-02-22: 2 mg via INTRAVENOUS

## 2012-02-22 MED ORDER — DOPAMINE-DEXTROSE 3.2-5 MG/ML-% IV SOLN
0.0000 ug/kg/min | INTRAVENOUS | Status: DC
  Administered 2012-02-22: 3 ug/kg/min via INTRAVENOUS

## 2012-02-22 MED ORDER — MORPHINE SULFATE 2 MG/ML IJ SOLN: 1.0000 mg | INTRAMUSCULAR | Status: AC | PRN

## 2012-02-22 MED ORDER — VANCOMYCIN HCL 1000 MG IV SOLR
1000.0000 mg | Freq: Once | INTRAVENOUS | Status: AC
  Administered 2012-02-22: 1000 mg via INTRAVENOUS
  Filled 2012-02-22: qty 1000

## 2012-02-22 MED ORDER — 0.9 % SODIUM CHLORIDE (POUR BTL) OPTIME
TOPICAL | Status: DC | PRN
  Administered 2012-02-22: 6000 mL

## 2012-02-22 MED ORDER — OXYCODONE HCL 5 MG PO TABS
5.0000 mg | ORAL_TABLET | ORAL | Status: DC | PRN
  Administered 2012-02-23 – 2012-03-01 (×16): 10 mg via ORAL
  Filled 2012-02-22 (×16): qty 2

## 2012-02-22 MED ORDER — ATORVASTATIN CALCIUM 40 MG PO TABS
40.0000 mg | ORAL_TABLET | Freq: Every day | ORAL | Status: DC
  Administered 2012-02-23 – 2012-03-02 (×9): 40 mg via ORAL
  Filled 2012-02-22 (×9): qty 1

## 2012-02-22 MED ORDER — VECURONIUM BROMIDE 10 MG IV SOLR
INTRAVENOUS | Status: DC | PRN
  Administered 2012-02-22: 6 mg via INTRAVENOUS
  Administered 2012-02-22: 4 mg via INTRAVENOUS
  Administered 2012-02-22 (×4): 5 mg via INTRAVENOUS

## 2012-02-22 MED ORDER — ROCURONIUM BROMIDE 100 MG/10ML IV SOLN
INTRAVENOUS | Status: DC | PRN
  Administered 2012-02-22: 50 mg via INTRAVENOUS

## 2012-02-22 MED ORDER — SODIUM CHLORIDE 0.45 % IV SOLN
INTRAVENOUS | Status: DC
  Administered 2012-02-22: 20 mL via INTRAVENOUS

## 2012-02-22 MED ORDER — ACETAMINOPHEN 160 MG/5ML PO SOLN: 975.0000 mg | Freq: Four times a day (QID) | ORAL | Status: AC

## 2012-02-22 MED ORDER — HEMOSTATIC AGENTS (NO CHARGE) OPTIME
TOPICAL | Status: DC | PRN
  Administered 2012-02-22: 1 via TOPICAL

## 2012-02-22 MED ORDER — CHLORHEXIDINE GLUCONATE 4 % EX LIQD: 30.0000 mL | CUTANEOUS | Status: DC

## 2012-02-22 MED ORDER — ALBUMIN HUMAN 5 % IV SOLN
250.0000 mL | INTRAVENOUS | Status: AC | PRN
  Administered 2012-02-22: 250 mL via INTRAVENOUS

## 2012-02-22 MED ORDER — ONDANSETRON HCL 4 MG/2ML IJ SOLN: 4.0000 mg | Freq: Four times a day (QID) | INTRAMUSCULAR | Status: DC | PRN

## 2012-02-22 MED ORDER — ACETAMINOPHEN 160 MG/5ML PO SOLN: 650.0000 mg | ORAL | Status: AC

## 2012-02-22 MED ORDER — MAGNESIUM SULFATE 40 MG/ML IJ SOLN
4.0000 g | Freq: Once | INTRAMUSCULAR | Status: AC
  Administered 2012-02-22: 4 g via INTRAVENOUS
  Filled 2012-02-22: qty 100

## 2012-02-22 MED ORDER — SODIUM CHLORIDE 0.9 % IV SOLN
INTRAVENOUS | Status: DC
  Administered 2012-02-22: via INTRAVENOUS
  Administered 2012-02-22: 8.7 [IU]/h via INTRAVENOUS
  Filled 2012-02-22 (×2): qty 1

## 2012-02-22 MED ORDER — TRANEXAMIC ACID (OHS) BOLUS VIA INFUSION
15.0000 mg/kg | INTRAVENOUS | Status: AC
  Administered 2012-02-22: 2001 mg via INTRAVENOUS

## 2012-02-22 MED ORDER — HEPARIN SODIUM (PORCINE) 1000 UNIT/ML IJ SOLN
INTRAMUSCULAR | Status: DC | PRN
  Administered 2012-02-22: 2000 [IU] via INTRAVENOUS
  Administered 2012-02-22: 38000 [IU] via INTRAVENOUS

## 2012-02-22 MED ORDER — INSULIN REGULAR BOLUS VIA INFUSION
0.0000 [IU] | Freq: Three times a day (TID) | INTRAVENOUS | Status: DC
  Filled 2012-02-22: qty 10

## 2012-02-22 MED ORDER — LACTATED RINGERS IV SOLN: 500.0000 mL | Freq: Once | INTRAVENOUS | Status: AC | PRN

## 2012-02-22 MED ORDER — BISACODYL 10 MG RE SUPP: 10.0000 mg | Freq: Every day | RECTAL | Status: DC

## 2012-02-22 MED ORDER — MIDAZOLAM HCL 2 MG/2ML IJ SOLN: 2.0000 mg | INTRAMUSCULAR | Status: DC | PRN

## 2012-02-22 MED ORDER — ALBUTEROL SULFATE HFA 108 (90 BASE) MCG/ACT IN AERS
2.0000 | INHALATION_SPRAY | Freq: Four times a day (QID) | RESPIRATORY_TRACT | Status: DC
  Filled 2012-02-22: qty 6.7

## 2012-02-22 MED ORDER — ACETAMINOPHEN 650 MG RE SUPP
650.0000 mg | RECTAL | Status: AC
  Administered 2012-02-22: 650 mg via RECTAL

## 2012-02-22 MED ORDER — SUCCINYLCHOLINE CHLORIDE 20 MG/ML IJ SOLN
INTRAMUSCULAR | Status: DC | PRN
  Administered 2012-02-22: 120 mg via INTRAVENOUS

## 2012-02-22 MED ORDER — FENTANYL CITRATE 0.05 MG/ML IJ SOLN
INTRAMUSCULAR | Status: DC | PRN
  Administered 2012-02-22: 250 ug via INTRAVENOUS
  Administered 2012-02-22: 100 ug via INTRAVENOUS
  Administered 2012-02-22: 150 ug via INTRAVENOUS
  Administered 2012-02-22: 250 ug via INTRAVENOUS
  Administered 2012-02-22: 100 ug via INTRAVENOUS
  Administered 2012-02-22 (×3): 150 ug via INTRAVENOUS
  Administered 2012-02-22 (×2): 250 ug via INTRAVENOUS
  Administered 2012-02-22: 50 ug via INTRAVENOUS
  Administered 2012-02-22: 150 ug via INTRAVENOUS

## 2012-02-22 MED ORDER — LACTATED RINGERS IV SOLN
INTRAVENOUS | Status: DC
  Administered 2012-02-22: 60 mL via INTRAVENOUS

## 2012-02-22 MED ORDER — SODIUM CHLORIDE 0.9 % IJ SOLN
3.0000 mL | Freq: Two times a day (BID) | INTRAMUSCULAR | Status: DC
  Administered 2012-02-23 – 2012-02-27 (×8): 3 mL via INTRAVENOUS

## 2012-02-22 MED ORDER — PANTOPRAZOLE SODIUM 40 MG PO TBEC
40.0000 mg | DELAYED_RELEASE_TABLET | Freq: Every day | ORAL | Status: DC
  Administered 2012-02-24 – 2012-03-02 (×8): 40 mg via ORAL
  Filled 2012-02-22 (×8): qty 1

## 2012-02-22 MED ORDER — SODIUM CHLORIDE 0.9 % IV SOLN
INTRAVENOUS | Status: DC
  Administered 2012-02-22: 20 mL via INTRAVENOUS
  Administered 2012-02-25: 21:00:00 via INTRAVENOUS

## 2012-02-22 MED ORDER — METOPROLOL TARTRATE 1 MG/ML IV SOLN: 2.5000 mg | INTRAVENOUS | Status: DC | PRN

## 2012-02-22 MED ORDER — SODIUM CHLORIDE 0.9 % IV SOLN: 250.0000 mL | INTRAVENOUS | Status: DC

## 2012-02-22 MED ORDER — MORPHINE SULFATE 4 MG/ML IJ SOLN
2.0000 mg | INTRAMUSCULAR | Status: DC | PRN
  Administered 2012-02-22 – 2012-02-24 (×11): 4 mg via INTRAVENOUS
  Filled 2012-02-22 (×11): qty 1

## 2012-02-22 MED ORDER — DOPAMINE-DEXTROSE 3.2-5 MG/ML-% IV SOLN
INTRAVENOUS | Status: DC | PRN
  Administered 2012-02-22: 3 ug/kg/min via INTRAVENOUS

## 2012-02-22 MED ORDER — ALBUMIN HUMAN 5 % IV SOLN
INTRAVENOUS | Status: DC | PRN
  Administered 2012-02-22 (×2): via INTRAVENOUS

## 2012-02-22 MED ORDER — SODIUM CHLORIDE 0.9 % IV SOLN
0.1000 ug/kg/h | INTRAVENOUS | Status: DC
  Filled 2012-02-22 (×2): qty 2

## 2012-02-22 SURGICAL SUPPLY — 87 items
ATTRACTOMAT 16X20 MAGNETIC DRP (DRAPES) ×2 IMPLANT
BAG DECANTER FOR FLEXI CONT (MISCELLANEOUS) ×2 IMPLANT
BANDAGE ELASTIC 4 VELCRO ST LF (GAUZE/BANDAGES/DRESSINGS) ×2 IMPLANT
BANDAGE ELASTIC 6 VELCRO ST LF (GAUZE/BANDAGES/DRESSINGS) ×2 IMPLANT
BANDAGE GAUZE ELAST BULKY 4 IN (GAUZE/BANDAGES/DRESSINGS) ×2 IMPLANT
BASKET HEART (ORDER IN 25'S) (MISCELLANEOUS) ×1
BASKET HEART (ORDER IN 25S) (MISCELLANEOUS) ×1 IMPLANT
BLADE STERNUM SYSTEM 6 (BLADE) ×2 IMPLANT
CANISTER SUCTION 2500CC (MISCELLANEOUS) ×2 IMPLANT
CANN PRFSN .5XCNCT 15X34-48 (MISCELLANEOUS)
CANNULA PRFSN .5XCNCT 15X34-48 (MISCELLANEOUS) IMPLANT
CANNULA VEN 2 STAGE (MISCELLANEOUS)
CATH CPB KIT HENDRICKSON (MISCELLANEOUS) ×2 IMPLANT
CATH ROBINSON RED A/P 18FR (CATHETERS) ×4 IMPLANT
CATH THORACIC 36FR (CATHETERS) ×2 IMPLANT
CATH THORACIC 36FR RT ANG (CATHETERS) ×4 IMPLANT
CLIP FOGARTY SPRING 6M (CLIP) ×4 IMPLANT
CLIP TI MEDIUM 24 (CLIP) IMPLANT
CLIP TI WIDE RED SMALL 24 (CLIP) ×6 IMPLANT
CLOTH BEACON ORANGE TIMEOUT ST (SAFETY) ×2 IMPLANT
COVER SURGICAL LIGHT HANDLE (MISCELLANEOUS) ×4 IMPLANT
CRADLE DONUT ADULT HEAD (MISCELLANEOUS) ×2 IMPLANT
DRAPE CARDIOVASCULAR INCISE (DRAPES) ×1
DRAPE SLUSH MACHINE 52X66 (DRAPES) IMPLANT
DRAPE SLUSH/WARMER DISC (DRAPES) IMPLANT
DRAPE SRG 135X102X78XABS (DRAPES) ×1 IMPLANT
DRSG COVADERM 4X14 (GAUZE/BANDAGES/DRESSINGS) ×2 IMPLANT
ELECT REM PT RETURN 9FT ADLT (ELECTROSURGICAL) ×4
ELECTRODE REM PT RTRN 9FT ADLT (ELECTROSURGICAL) ×2 IMPLANT
GLOVE BIO SURGEON STRL SZ 6 (GLOVE) ×6 IMPLANT
GLOVE BIO SURGEON STRL SZ 6.5 (GLOVE) ×10 IMPLANT
GLOVE BIO SURGEON STRL SZ7 (GLOVE) ×8 IMPLANT
GLOVE BIO SURGEON STRL SZ7.5 (GLOVE) ×4 IMPLANT
GLOVE EUDERMIC 7 POWDERFREE (GLOVE) ×6 IMPLANT
GOWN PREVENTION PLUS XLARGE (GOWN DISPOSABLE) ×8 IMPLANT
GOWN STRL NON-REIN LRG LVL3 (GOWN DISPOSABLE) ×8 IMPLANT
HEMOSTAT POWDER SURGIFOAM 1G (HEMOSTASIS) ×6 IMPLANT
HEMOSTAT SURGICEL 2X14 (HEMOSTASIS) ×2 IMPLANT
INSERT FOGARTY XLG (MISCELLANEOUS) ×2 IMPLANT
KIT BASIN OR (CUSTOM PROCEDURE TRAY) ×2 IMPLANT
KIT ROOM TURNOVER OR (KITS) ×2 IMPLANT
KIT SUCTION CATH 14FR (SUCTIONS) ×4 IMPLANT
KIT VASOVIEW W/TROCAR VH 2000 (KITS) ×2 IMPLANT
MARKER GRAFT CORONARY BYPASS (MISCELLANEOUS) ×6 IMPLANT
NS IRRIG 1000ML POUR BTL (IV SOLUTION) ×10 IMPLANT
PACK OPEN HEART (CUSTOM PROCEDURE TRAY) ×2 IMPLANT
PAD ARMBOARD 7.5X6 YLW CONV (MISCELLANEOUS) ×4 IMPLANT
PENCIL BUTTON HOLSTER BLD 10FT (ELECTRODE) ×2 IMPLANT
PUNCH AORTIC ROTATE 4.0MM (MISCELLANEOUS) IMPLANT
PUNCH AORTIC ROTATE 4.5MM 8IN (MISCELLANEOUS) ×2 IMPLANT
PUNCH AORTIC ROTATE 5MM 8IN (MISCELLANEOUS) IMPLANT
SET CARDIOPLEGIA MPS 5001102 (MISCELLANEOUS) ×2 IMPLANT
SPONGE GAUZE 4X4 12PLY (GAUZE/BANDAGES/DRESSINGS) ×4 IMPLANT
SPONGE LAP 4X18 X RAY DECT (DISPOSABLE) ×2 IMPLANT
SUT BONE WAX W31G (SUTURE) ×2 IMPLANT
SUT MNCRL AB 4-0 PS2 18 (SUTURE) IMPLANT
SUT PROLENE 3 0 SH DA (SUTURE) ×2 IMPLANT
SUT PROLENE 4 0 RB 1 (SUTURE) ×1
SUT PROLENE 4 0 SH DA (SUTURE) ×2 IMPLANT
SUT PROLENE 4-0 RB1 .5 CRCL 36 (SUTURE) ×1 IMPLANT
SUT PROLENE 6 0 C 1 30 (SUTURE) ×14 IMPLANT
SUT PROLENE 7 0 BV 1 (SUTURE) ×6 IMPLANT
SUT PROLENE 7 0 BV1 MDA (SUTURE) ×4 IMPLANT
SUT PROLENE 8 0 BV175 6 (SUTURE) ×2 IMPLANT
SUT SILK  1 MH (SUTURE) ×1
SUT SILK 1 MH (SUTURE) ×1 IMPLANT
SUT STEEL 6MS V (SUTURE) IMPLANT
SUT STEEL STERNAL CCS#1 18IN (SUTURE) IMPLANT
SUT STEEL SZ 6 DBL 3X14 BALL (SUTURE) ×6 IMPLANT
SUT VIC AB 1 CTX 36 (SUTURE) ×2
SUT VIC AB 1 CTX36XBRD ANBCTR (SUTURE) ×2 IMPLANT
SUT VIC AB 2-0 CT1 27 (SUTURE) ×1
SUT VIC AB 2-0 CT1 TAPERPNT 27 (SUTURE) ×1 IMPLANT
SUT VIC AB 2-0 CTX 27 (SUTURE) IMPLANT
SUT VIC AB 3-0 SH 27 (SUTURE)
SUT VIC AB 3-0 SH 27X BRD (SUTURE) IMPLANT
SUT VIC AB 3-0 X1 27 (SUTURE) ×2 IMPLANT
SUT VICRYL 4-0 PS2 18IN ABS (SUTURE) IMPLANT
SUTURE E-PAK OPEN HEART (SUTURE) ×2 IMPLANT
SYSTEM SAHARA CHEST DRAIN ATS (WOUND CARE) ×2 IMPLANT
TOWEL OR 17X24 6PK STRL BLUE (TOWEL DISPOSABLE) ×4 IMPLANT
TOWEL OR 17X26 10 PK STRL BLUE (TOWEL DISPOSABLE) ×4 IMPLANT
TRAY FOLEY IC TEMP SENS 14FR (CATHETERS) ×2 IMPLANT
TUBE FEEDING 8FR 16IN STR KANG (MISCELLANEOUS) ×2 IMPLANT
TUBING INSUFFLATION 10FT LAP (TUBING) ×2 IMPLANT
UNDERPAD 30X30 INCONTINENT (UNDERPADS AND DIAPERS) ×2 IMPLANT
WATER STERILE IRR 1000ML POUR (IV SOLUTION) ×4 IMPLANT

## 2012-02-22 NOTE — H&P (View-Only) (Signed)
Reason for Consult:3 vessel CAD Referring Physician: Dr. Michael Boston [name] is an 66 y.o. male.  HPI: 66 yo male with history of CAD, s/p PTCA/ stent in [place] in 2006. On aspirin and Plavix since that time. He is morbidly obese, continues to smoke and has HTN and hypercholesterolemia. He has been having CP for about a year. Usually exertional, relieved with rest and he has not had to take NTG. He says CP has awakened him from sleep a couple of times, but not recently. He had a cardiolite which showed significant ischemia and was scheduled for cath today. At cath was found to have 3 vessel CAD. Currently pain free.  Past Medical History  Diagnosis Date  . Other malaise and fatigue   . Swelling of limb   . Edema   . Osteoarthrosis, unspecified whether generalized or localized, unspecified site   . Congestive heart failure, unspecified   . CAD (coronary artery disease), native coronary artery 2006    Stent placed in 2006 in [place], [place]  . Pure hypercholesterolemia   . Hypertension   . Morbid obesity   . Tobacco abuse   . Myocardial infarction   . Shortness of breath   . Diabetes mellitus     Past Surgical History  Procedure Date  . Cardiac catheterization   . Coronary angioplasty with stent placement     History reviewed. No pertinent family history.  Social History:  reports that he has been smoking Cigarettes.  He has a 15 pack-year smoking history. He has never used smokeless tobacco. He reports that he does not drink alcohol or use illicit drugs.  Allergies:  Allergies  Allergen Reactions  . Ciprofloxacin Hcl Other (See Comments)    Fluoroquinolones:difficulty swallowing and Sore mouth    Medications:  I have reviewed the patient's current medications. Prior to Admission:  Prescriptions prior to admission  Medication Sig Dispense Refill  . aspirin EC 81 MG tablet Take 81 mg by mouth daily.      [name] atorvastatin (LIPITOR) 40 MG tablet Take 40 mg by mouth  daily.      . clopidogrel (PLAVIX) 75 MG tablet Take 75 mg by mouth daily.      . colchicine 0.6 MG tablet Take 0.6 mg by mouth 2 (two) times daily as needed. For gout flare      . furosemide (LASIX) 40 MG tablet Take 40 mg by mouth daily.      [name] lisinopril (PRINIVIL,ZESTRIL) 10 MG tablet Take 10 mg by mouth daily.      . metoprolol (LOPRESSOR) 50 MG tablet Take 25 mg by mouth daily.      . nitroGLYCERIN (NITROSTAT) 0.4 MG SL tablet Place 0.4 mg under the tongue every 5 (five) minutes as needed. For chest pain        Results for orders placed during the hospital encounter of 02/06/12 (from the past 48 hour(s))  GLUCOSE, CAPILLARY     Status: Abnormal   Collection Time   02/06/12  7:24 AM      Component Value Range Comment   Glucose-Capillary 118 (*) 70 - 99 (mg/dL)   GLUCOSE, CAPILLARY     Status: Abnormal   Collection Time   02/06/12 10:09 AM      Component Value Range Comment   Glucose-Capillary 112 (*) 70 - 99 (mg/dL)     No results found.  Review of Systems  Constitutional: Positive for malaise/fatigue. Negative for fever, chills and weight loss.  Eyes:  Negative.   Respiratory: Positive for shortness of breath, wheezing and stridor. Negative for cough and sputum production.   Cardiovascular: Positive for chest pain and leg swelling. Negative for claudication.  Gastrointestinal: Negative.   Genitourinary: Negative.   Musculoskeletal: Positive for joint pain.  Skin:       Healed ulcer left leg  Neurological: Negative.   Endo/Heme/Allergies: Does not bruise/bleed easily.  Psychiatric/Behavioral: Negative.   All other systems reviewed and are negative.   Blood pressure 153/81, pulse 55, temperature 98.7 F (37.1 C), temperature source Oral, resp. rate 18, height 5\' 6"  (1.676 m), weight 293 lb (132.904 kg), SpO2 100.00%. Physical Exam  Constitutional: He is oriented to person, place, and time. No distress.       Morbidly obese  HENT:  Head: Normocephalic and atraumatic.    Eyes: EOM are normal. Pupils are equal, round, and reactive to light.  Neck: Neck supple. No JVD present. No tracheal deviation present. No thyromegaly present.  Cardiovascular: Normal rate, regular rhythm and intact distal pulses.  Exam reveals no gallop and no friction rub.   Murmur (2/6 systolic) heard. Respiratory: Effort normal and breath sounds normal. Stridor present. He has no wheezes. He has no rales.  GI: Soft. There is no tenderness.       obese  Musculoskeletal: He exhibits edema (trace).       Chronic venous stasis changes both LE  Lymphadenopathy:    He has no cervical adenopathy.  Neurological: He is alert and oriented to person, place, and time.  Skin: Skin is warm and dry.  Psychiatric: He has a normal mood and affect.    Assessment/Plan: 66 yo male with known CAD and multiple medical and lifestyle CRF who presents with angina, primarily exertional. He had a positive cardiolite and at cath has severe 3 vessel CAD. CABG is indicated for survival benefit and relief of symptoms.   I have discussed with the patient and his family the general nature of the procedure, need for general anesthesia,and incisions to be used. I have discussed the expected hospital stay, overall recovery and short and long term outcomes. They understand the risks include but are not limited to death, stroke, MI, DVT/PE, bleeding, possible need for transfusion, infections, other organ system dysfunction including respiratory, renal, or GI complications. He understands and accepts these risks and wishes to proceed.  I advised him that he needs to be off Plavix for 5-7 days prior to surgery. Therefore planned surgery would be Monday February 13, 2012.  He insists he needs to leave the hospital prior to surgery. I advised him that there would be significant risk to doing that.  He has extremely poor dentition and needs an orthopantogram and dental evaluation preoperatively.   Flynn Lininger  C 02/06/2012, 4:11 PM

## 2012-02-22 NOTE — Preoperative (Signed)
Beta Blockers   Reason not to administer Beta Blockers:Not Applicable, last dose 02/22/12 at 04:00

## 2012-02-22 NOTE — Anesthesia Procedure Notes (Addendum)
Procedure Name: Intubation Date/Time: 02/22/2012 8:40 AM Performed by: Jefm Miles E Pre-anesthesia Checklist: Patient identified, Timeout performed, Emergency Drugs available, Suction available and Patient being monitored Patient Re-evaluated:Patient Re-evaluated prior to inductionOxygen Delivery Method: Circle system utilized Preoxygenation: Pre-oxygenation with 100% oxygen Intubation Type: IV induction, Cricoid Pressure applied and Rapid sequence Ventilation: Mask ventilation without difficulty Laryngoscope Size: Mac and 4 Grade View: Grade I Tube type: Oral Tube size: 8.0 mm Number of attempts: 1 Airway Equipment and Method: Stylet Placement Confirmation: ETT inserted through vocal cords under direct vision,  breath sounds checked- equal and bilateral and positive ETCO2 Secured at: 22 cm Tube secured with: Tape Dental Injury: Teeth and Oropharynx as per pre-operative assessment

## 2012-02-22 NOTE — Interval H&P Note (Signed)
History and Physical Interval Note:  02/22/2012 8:17 AM  [name]  has presented today for surgery, with the diagnosis of CAD  The various methods of treatment have been discussed with the patient and family. After consideration of risks, benefits and other options for treatment, the patient has consented to  Procedure(s) (LRB): CORONARY ARTERY BYPASS GRAFTING (CABG) (N/A) as a surgical intervention .  The patients' history has been reviewed, patient examined, no change in status, stable for surgery.  I have reviewed the patients' chart and labs.  Questions were answered to the patient's satisfaction.     Taren Dymek C  Has been off plavix and has quit smoking in the interim

## 2012-02-22 NOTE — OR Nursing (Signed)
13:55pm 1st call to SICU, call to vol. Desk to inform family off pump.  2nd call made to SICU @ 14:20pm.

## 2012-02-22 NOTE — Brief Op Note (Addendum)
                   [address]            [place] [address]          [phone]    02/22/2012  1:00 PM  PATIENT:  [name]  66 y.o. male  PRE-OPERATIVE DIAGNOSIS:  CAD  POST-OPERATIVE DIAGNOSIS:  CAD  PROCEDURE:  Procedure(s): CORONARY ARTERY BYPASS GRAFTING (CABG)X4 (LIMA-LAD; SVG-PDA; SVG-OM; SVG-DIAG) EVH RIGHT LEG  SURGEON:  Surgeon(s): Loreli Slot, MD  PHYSICIAN ASSISTANT: WAYNE GOLD PA-C  ANESTHESIA:   general  PATIENT CONDITION:  ICU - intubated and hemodynamically stable.  PRE-OPERATIVE WEIGHT: 133kg  COMPLICATIONS: NO KNOWN    XC 91 min CPB 148 min  OM poor quality diffusely diseased target, endarterctomy attempted but unsuccessful- grafted very distally Remaining targets good quality

## 2012-02-22 NOTE — Progress Notes (Signed)
Patient ID: [name], male   DOB: 1946/11/06, 66 y.o.   MRN: [id number] Starting to wake up BP 106/69  Pulse 102  Temp(Src) 97.9 F (36.6 C) (Core (Comment))  Resp 22  Wt 294 lb (133.358 kg)  SpO2 98%  Intake/Output Summary (Last 24 hours) at 02/22/12 1836 Last data filed at 02/22/12 1811  Gross per 24 hour  Intake 4940.26 ml  Output   2350 ml  Net 2590.26 ml   Initial CXR RUL atelectasis,  Hypercarbia- improved Wean to extubate

## 2012-02-22 NOTE — Transfer of Care (Signed)
Immediate Anesthesia Transfer of Care Note  Patient: [name]  Procedure(s) Performed: Procedure(s) (LRB): CORONARY ARTERY BYPASS GRAFTING (CABG) (N/A)  Patient Location: SICU  Anesthesia Type: General  Level of Consciousness: Patient remains intubated per anesthesia plan  Airway & Oxygen Therapy: Patient remains intubated per anesthesia plan  Post-op Assessment: Report given to PACU RN and Post -op Vital signs reviewed and stable  Post vital signs: Reviewed  Complications: No apparent anesthesia complications

## 2012-02-22 NOTE — Anesthesia Preprocedure Evaluation (Addendum)
Anesthesia Evaluation  Patient identified by MRN, date of birth, ID band Patient awake    Reviewed: Allergy & Precautions, H&P , NPO status , Patient's Chart, lab work & pertinent test results  Airway Mallampati: II TM Distance: >3 FB Neck ROM: Full    Dental  (+) Partial Lower, Partial Upper, Missing, Poor Dentition and Dental Advisory Given   Pulmonary shortness of breath and at rest, asthma ,  breath sounds clear to auscultation        Cardiovascular hypertension, Pt. on medications + CAD, + Past MI and +CHF Rhythm:Regular Rate:Normal     Neuro/Psych negative neurological ROS  negative psych ROS   GI/Hepatic negative GI ROS, Neg liver ROS,   Endo/Other  Diabetes mellitus-  Renal/GU      Musculoskeletal   Abdominal   Peds  Hematology negative hematology ROS (+)   Anesthesia Other Findings   Reproductive/Obstetrics                          Anesthesia Physical Anesthesia Plan  ASA: IV  Anesthesia Plan: General   Post-op Pain Management:    Induction: Intravenous  Airway Management Planned: Oral ETT  Additional Equipment: Arterial line and PA Cath  Intra-op Plan:   Post-operative Plan: Post-operative intubation/ventilation  Informed Consent: I have reviewed the patients History and Physical, chart, labs and discussed the procedure including the risks, benefits and alternatives for the proposed anesthesia with the patient or authorized representative who has indicated his/her understanding and acceptance.   Dental advisory given  Plan Discussed with: CRNA  Anesthesia Plan Comments:         Anesthesia Quick Evaluation

## 2012-02-23 ENCOUNTER — Inpatient Hospital Stay (HOSPITAL_COMMUNITY): Payer: Medicare Other

## 2012-02-23 ENCOUNTER — Encounter (HOSPITAL_COMMUNITY): Payer: Self-pay | Admitting: Thoracic Surgery (Cardiothoracic Vascular Surgery)

## 2012-02-23 LAB — BASIC METABOLIC PANEL
CO2: 22 mEq/L (ref 19–32)
Calcium: 7.7 mg/dL — ABNORMAL LOW (ref 8.4–10.5)
Chloride: 106 mEq/L (ref 96–112)
Creatinine, Ser: 1.56 mg/dL — ABNORMAL HIGH (ref 0.50–1.35)
Glucose, Bld: 130 mg/dL — ABNORMAL HIGH (ref 70–99)

## 2012-02-23 LAB — CBC
HCT: 37.9 % — ABNORMAL LOW (ref 39.0–52.0)
HCT: 37.1 % — ABNORMAL LOW (ref 39.0–52.0)
HCT: 35.6 % — ABNORMAL LOW (ref 39.0–52.0)
Hemoglobin: 12.1 g/dL — ABNORMAL LOW (ref 13.0–17.0)
Hemoglobin: 11.2 g/dL — ABNORMAL LOW (ref 13.0–17.0)
MCH: 28.5 pg (ref 26.0–34.0)
MCH: 29 pg (ref 26.0–34.0)
MCHC: 31.5 g/dL (ref 30.0–36.0)
MCV: 89.4 fL (ref 78.0–100.0)
MCV: 89.6 fL (ref 78.0–100.0)
Platelets: 120 10*3/uL — ABNORMAL LOW (ref 150–400)
RBC: 4.24 MIL/uL (ref 4.22–5.81)
RBC: 4.14 MIL/uL — ABNORMAL LOW (ref 4.22–5.81)
RDW: 14 % (ref 11.5–15.5)
RDW: 14.4 % (ref 11.5–15.5)
WBC: 26.2 10*3/uL — ABNORMAL HIGH (ref 4.0–10.5)

## 2012-02-23 LAB — CREATININE, SERUM
Creatinine, Ser: 2.39 mg/dL — ABNORMAL HIGH (ref 0.50–1.35)
GFR calc Af Amer: 47 mL/min — ABNORMAL LOW (ref >90)
GFR calc Af Amer: 31 mL/min — ABNORMAL LOW (ref >90)
GFR calc non Af Amer: 27 mL/min — ABNORMAL LOW (ref >90)

## 2012-02-23 LAB — MAGNESIUM: Magnesium: 2.4 mg/dL (ref 1.5–2.5)

## 2012-02-23 LAB — POCT I-STAT, CHEM 8
Creatinine, Ser: 2.3 mg/dL — ABNORMAL HIGH (ref 0.50–1.35)
Hemoglobin: 11.6 g/dL — ABNORMAL LOW (ref 13.0–17.0)
Potassium: 5 mEq/L (ref 3.5–5.1)
Sodium: 138 mEq/L (ref 135–145)
TCO2: 25 mmol/L (ref 0–100)

## 2012-02-23 LAB — GLUCOSE, CAPILLARY
Glucose-Capillary: 106 mg/dL — ABNORMAL HIGH (ref 70–99)
Glucose-Capillary: 114 mg/dL — ABNORMAL HIGH (ref 70–99)
Glucose-Capillary: 116 mg/dL — ABNORMAL HIGH (ref 70–99)
Glucose-Capillary: 117 mg/dL — ABNORMAL HIGH (ref 70–99)
Glucose-Capillary: 127 mg/dL — ABNORMAL HIGH (ref 70–99)
Glucose-Capillary: 128 mg/dL — ABNORMAL HIGH (ref 70–99)
Glucose-Capillary: 145 mg/dL — ABNORMAL HIGH (ref 70–99)
Glucose-Capillary: 145 mg/dL — ABNORMAL HIGH (ref 70–99)

## 2012-02-23 MED ORDER — ENOXAPARIN SODIUM 40 MG/0.4ML ~~LOC~~ SOLN
40.0000 mg | Freq: Every day | SUBCUTANEOUS | Status: DC
  Administered 2012-02-23 – 2012-03-01 (×8): 40 mg via SUBCUTANEOUS
  Filled 2012-02-23 (×9): qty 0.4

## 2012-02-23 MED ORDER — INSULIN ASPART 100 UNIT/ML ~~LOC~~ SOLN
4.0000 [IU] | Freq: Three times a day (TID) | SUBCUTANEOUS | Status: DC
  Administered 2012-02-23 – 2012-03-01 (×7): 4 [IU] via SUBCUTANEOUS

## 2012-02-23 MED ORDER — METOPROLOL TARTRATE 25 MG PO TABS
25.0000 mg | ORAL_TABLET | Freq: Two times a day (BID) | ORAL | Status: DC
  Administered 2012-02-24 – 2012-03-02 (×13): 25 mg via ORAL
  Filled 2012-02-23 (×18): qty 1

## 2012-02-23 MED ORDER — FUROSEMIDE 10 MG/ML IJ SOLN
40.0000 mg | Freq: Two times a day (BID) | INTRAMUSCULAR | Status: DC
  Administered 2012-02-23 (×2): 40 mg via INTRAVENOUS
  Filled 2012-02-23 (×5): qty 4

## 2012-02-23 MED ORDER — METOPROLOL TARTRATE 25 MG/10 ML ORAL SUSPENSION
25.0000 mg | Freq: Two times a day (BID) | ORAL | Status: DC
  Filled 2012-02-23 (×18): qty 10

## 2012-02-23 MED ORDER — DOPAMINE-DEXTROSE 3.2-5 MG/ML-% IV SOLN
3.0000 ug/kg/min | INTRAVENOUS | Status: DC
  Administered 2012-02-23 – 2012-02-26 (×3): 3 ug/kg/min via INTRAVENOUS
  Filled 2012-02-23 (×2): qty 250

## 2012-02-23 MED ORDER — INSULIN ASPART 100 UNIT/ML ~~LOC~~ SOLN
0.0000 [IU] | SUBCUTANEOUS | Status: DC
  Administered 2012-02-23 – 2012-02-24 (×5): 2 [IU] via SUBCUTANEOUS

## 2012-02-23 MED ORDER — INSULIN GLARGINE 100 UNIT/ML ~~LOC~~ SOLN
35.0000 [IU] | SUBCUTANEOUS | Status: DC
  Administered 2012-02-23 – 2012-03-02 (×9): 35 [IU] via SUBCUTANEOUS

## 2012-02-23 NOTE — Progress Notes (Signed)
UR Completed.   [name] 02/23/2012 [phone]

## 2012-02-23 NOTE — Progress Notes (Signed)
   CARE MANAGEMENT NOTE 02/23/2012  Patient:  [name], [name]   Account Number:  [id number]  Date Initiated:  02/23/2012  Documentation initiated by:  [facility]  Subjective/Objective Assessment:   CABG  has children     Action/Plan:   PTA, PT INDEPENDENT, LIVES WITH 66 YEAR OLD SON.  PT ALSO HAS 36 YO SON, WHO IS A TRUCK DRIVER.  SON AT BEDSIDE, STATES HE CANNOT PROVIDE 24HR CARE AT DISCHARGE.   Anticipated DC Date:  02/28/2012   Anticipated DC Plan:  SKILLED NURSING FACILITY  In-house referral  Clinical Social Worker      DC Planning Services  CM consult      Choice offered to / List presented to:             Status of service:  In process, will continue to follow Medicare Important Message given?   (If response is "NO", the following Medicare IM given date fields will be blank) Date Medicare IM given:   Date Additional Medicare IM given:    Discharge Disposition:    Per UR Regulation:  Reviewed for med. necessity/level of care/duration of stay  If discussed at Long Length of Stay Meetings, dates discussed:    Comments:  02/23/12 Shanisha Lech,RN,BSN 1400 PT STATES HE IS WILLING TO GO TO SHORT TERM SNF FOR REHAB, AS HIS ADULT SON IS UNABLE TO PROVIDE 24HR CARE AT DISCHARGE.  WILL CONSULT CSW TO FACILITATE DC TO SNF WHEN MEDICALLY STABLE FOR DISCHARGE.  WILL OBTAIN P.T. CONSULT. Phone #[phone]

## 2012-02-23 NOTE — Progress Notes (Signed)
Patient ID: [name], male   DOB: August 17, 1946, 67 y.o.   MRN: [id number]  Filed Vitals:   02/23/12 1600 02/23/12 1624 02/23/12 1700 02/23/12 1800  BP: 92/52  92/53 112/50  Pulse: 78  76 95  Temp:  98.2 F (36.8 C)    TempSrc:  Oral    Resp: 33  20 19  Height:      Weight:      SpO2: 91%  93% 86%   Urine output 10-15 cc per hour.  On renal dop 3. Creat up.  BMET    Component Value Date/Time   NA 138 02/23/2012 1640   K 5.0 02/23/2012 1640   CL 104 02/23/2012 1640   CO2 22 02/23/2012 0410   GLUCOSE 130* 02/23/2012 1640   BUN 29* 02/23/2012 1640   CREATININE 2.39* 02/23/2012 1700   CALCIUM 7.7* 02/23/2012 0410   GFRNONAA 27* 02/23/2012 1700   GFRAA 31* 02/23/2012 1700    CBC    Component Value Date/Time   WBC 26.2* 02/23/2012 1700   RBC 3.83* 02/23/2012 1700   HGB 11.2* 02/23/2012 1700   HCT 35.6* 02/23/2012 1700   PLT 120* 02/23/2012 1700   MCV 93.0 02/23/2012 1700   MCH 29.2 02/23/2012 1700   MCHC 31.5 02/23/2012 1700   RDW 14.4 02/23/2012 1700    Up in chair on facemask O2 with sats 88%.  He may need bipap.  He is awake and alert.

## 2012-02-23 NOTE — Anesthesia Postprocedure Evaluation (Signed)
  Anesthesia Post-op Note  Patient: [name]  Procedure(s) Performed: Procedure(s) (LRB): CORONARY ARTERY BYPASS GRAFTING (CABG) (N/A)  Patient Location: PACU and SICU  Anesthesia Type: General  Level of Consciousness: awake  Airway and Oxygen Therapy: Patient Spontanous Breathing  Post-op Pain: mild  Post-op Assessment: Post-op Vital signs reviewed  Post-op Vital Signs: Reviewed  Complications: No apparent anesthesia complications

## 2012-02-23 NOTE — Op Note (Signed)
[name], [name]               ACCOUNT NO.:  [id number]  MEDICAL RECORD NO.:  [id number]  LOCATION:  2309                         FACILITY:  [facility]  PHYSICIAN:  Salvatore Decent. Dorris Fetch, M.D.DATE OF BIRTH:  12-03-1945  DATE OF PROCEDURE: DATE OF DISCHARGE:                              OPERATIVE REPORT   PREOPERATIVE DIAGNOSIS:  Severe three-vessel coronary artery disease with exertional angina.  POSTOPERATIVE DIAGNOSIS:  Severe three-vessel coronary artery disease with exertional angina.  PROCEDURE:  Median sternotomy, extracorporeal circulation, coronary bypass grafting x4 (left internal mammary artery to left anterior descending artery, saphenous vein graft to posterior descending, saphenous vein graft to first diagonal, saphenous vein graft to obtuse marginal 1), endoscopic vein harvest, right leg.  SURGEON:  Salvatore Decent. Dorris Fetch, MD  ASSISTANT:  Rowe Clack, PA-C  ANESTHESIA:  General.  FINDINGS:  OM 1 poor-quality target.  Attempted endarterectomy unsuccessful, grafted very distally, but did have reasonable flow. Remaining targets are good quality, good-quality conduits, morbid obesity.  CLINICAL INDICATION:  [name] is a 66 year old gentleman with multiple medical problems, who has a known history of coronary artery disease.  He has about a 1 year history of exertional angina, this is recently progressed.  He had a positive Cardiolite and was advised to undergo cardiac catheterization that revealed severe three-vessel coronary artery disease.  He was then referred for consideration for coronary artery bypass grafting, although somewhat high risk.  Due to his comorbidities, the patient was felt to be a suitable candidate for bypass grafting.  The indications, risks, benefits, and alternative treatments were discussed in detail with the patient.  He understood and accepted the risks and agreed to proceed.  OPERATIVE NOTE:  [name] was brought to the preop  holding area on February 22, 2012, there, the Anesthesia Service placed a Swan-Ganz catheter and arterial blood pressure monitoring line.  Intravenous antibiotics were administered.  He was taken to the operating room, anesthetized, and intubated.  A Foley catheter was placed.  The chest, abdomen, and legs were prepped and draped in usual sterile fashion.  A median sternotomy was performed.  The left internal mammary artery was harvested using standard technique.  This was difficult to the patient's body habitus.  Simultaneously, incision was made in the medial aspect of the right leg at the level of the knee.  Saphenous vein was harvested from midcalf to groin endoscopically.  2000 units of heparin was administered during the vessel harvest.  Remainder of the full heparin dose was given prior to opening the pericardium.  After harvesting the conduit, the pericardium was opened, the ascending aorta was inspected.  After confirming adequate anticoagulation with ACT measurement, the aorta was cannulated via concentric 2-0 Ethibond pledgeted pursestring sutures.  A dual-stage venous cannula was placed via pursestring suture in the right atrial appendage.  Cardiopulmonary bypass was instituted, and the patient was cooled to 32 degrees Celsius. The coronary arteries were inspected and anastomotic sites were chosen. The conduits were inspected and cut to length.  A foam pad was placed in the pericardium to insulate the heart and protect the left phrenic nerve.  A temperature probe was placed in the myocardial septum and a cardioplegia  cannula was placed in the ascending aorta.  The aorta was crossclamped.  The left ventricle was emptied via the aortic root vent.  Cardiac arrest then was achieved with combination of cold antegrade blood cardioplegia and topical iced saline.  A 1.5 liters of cardioplegia was administered.  Myocardial temperature fell to 13 degrees Celsius.  The following distal  anastomoses were performed.  First, a reversed saphenous vein graft was placed end-to-side to the first diagonal branch of the LAD.  This was a 1.5-mm vessel, good quality at the site of the anastomosis.  The vein was anastomosed end-to- side with a running 7-0 Prolene suture.  There was good flow through the graft and good hemostasis at the anastomosis with cardioplegia administration.  Next, a reversed saphenous vein graft was placed end-to-side to the posterior descending branch of the right coronary.  This was a 1.5-mm vessel, good quality at the site of the anastomosis.  The vein graft was anastomosed end-to-side with running 7-0 Prolene suture.  Again, there was good flow and good hemostasis at this anastomosis.  Next, the heart was elevated to inspect the obtuse marginal.  The left circumflex gave off the single large obtuse marginal branch, which gave off several smaller branches.  This vessel was diffusely diseased well out on to the lateral wall with calcified plaque.  An arteriotomy was made and attempt was made to endarterectomize this plaque to allow placement of the graft before the final bifurcation.  When trying to endarterectomize the vessel, there was shearing of the back wall and it was noted that this site would not be suitable for use as a graft target site.  Therefore, an arteriotomy was made further distally in the vessel well out towards the apex of the heart laterally. It did accept a 1.5-mm probe at that site and the vein was anastomosed end-to- side with a running 7-0 Prolene suture.  The portion of the artery where the endarterectomy was attempted was oversewn.  There was reasonable flow through the OM1 graft with cardioplegia administration.  Additional cardioplegia was also administered down the aortic root at this time.  Next, the left internal mammary artery was brought through the window in the pericardium.  The distal end was beveled.  It was then  anastomosed end-to-side to the distal LAD, which was a 1.5-mm target.  The mammary was a 1.5-mm conduit and end-to-side anastomosis was performed with a running 8-0 Prolene suture.  At completion of the mammary to LAD anastomosis, the bulldog clamp was briefly removed to inspect for hemostasis.  Immediate rapid septal rewarming was noted.  The bulldog clamp was replaced.  The mammary pedicle was tacked to the epicardial surface of the heart with 6-0 Prolene sutures.  Additional cardioplegia was administered.  The vein grafts were cut to length.  There was not sufficient vein length on the diagonal graft to reach the aorta.  The proximal vein graft anastomoses for the obtuse marginal and posterior descending grafts were performed to 4.5-mm punch aortotomies with running 6-0 Prolene sutures.  The patient then was placed in Trendelenburg position.  Lidocaine was administered.  The aortic root was de-aired and the aortic crossclamp was removed.  Total crossclamp time was 91 minutes.  Bulldog clamps were placed proximally and distally on the obtuse marginal graft.  Venotomy was made and the proximal anastomosis for the diagonal vein was placed to the obtuse marginal vein in an end-to-side fashion with a running 7-0 Prolene suture.  After de-airing the anastomosis, all  clamps were removed.  The patient required two defibrillations with 10 joules and then was in a bradycardic rhythm thereafter.  While rewarming was completed, all proximal and distal anastomoses were inspected for hemostasis.  Epicardial pacing wires were placed on the right ventricle and right atrium.  DDD pacing was initiated at a low-dose dopamine infusion at 3 mcg/kg/minute was initiated.  When the patient rewarmed to a core temperature of 37 degrees Celsius, he was weaned from cardiopulmonary bypass on the first attempt.  Total bypass time was 148 minutes.  The initial cardiac index was greater than 2 liters/minute/meter  squared and the patient remained hemodynamically stable throughout the postbypass period.  A test dose of protamine was administered and was well tolerated.  The atrial and aortic cannulae were removed.  The remainder of the protamine was administered without incident.  The chest was irrigated with warm saline.  Hemostasis was achieved.  Pericardium was not closed.  Left pleural and single mediastinal chest tubes were placed through a separate subcostal incisions.  The sternum was closed with interrupted heavy-gauge double stainless steel wires.  Pectoralis fascia, subcutaneous tissue, and skin were closed in standard fashion.  All sponge, needle, and instrument counts were correct at the end of the procedure.  The patient has taken from the operating room to the surgical intensive care unit in good condition.     Salvatore Decent Dorris Fetch, M.D.     SCH/MEDQ  D:  02/22/2012  T:  02/23/2012  Job:  [id number]

## 2012-02-23 NOTE — Progress Notes (Signed)
Patient sat at side of bed with assistance from two staff members.  Moderate bright, red blood noted through dressing where chest tubes were removed.  Patient's O2 saturation decreased into the low 80's on 4L nasal cannula. Assisted patient back to bed and held pressure to chest tube sites until the bleeding stopped.  Moderate amount of bright,red blood also noted to be draining from sternal incision, which also stopped after holding pressure. Dressing changed and patient's oxygen level returned to the 90's.  Pt c/o discomfort to site. Bilateral lungs sounds diminished, which is unchanged from earlier assessment.  Will continue to monitor.

## 2012-02-23 NOTE — Progress Notes (Signed)
1 Day Post-Op Procedure(s) (LRB): CORONARY ARTERY BYPASS GRAFTING (CABG) (N/A) Subjective: C/o difficulty taking a deep breath Some incisional discomfort No nausea  Objective: Vital signs in last 24 hours: Temp:  [96.3 F (35.7 C)-98.8 F (37.1 C)] 98.4 F (36.9 C) (04/11 0800) Pulse Rate:  [79-119] 89  (04/11 0800) Cardiac Rhythm:  [-] Normal sinus rhythm (04/11 0800) Resp:  [11-40] 32  (04/11 0800) BP: (93-137)/(45-76) 110/45 mmHg (04/11 0800) SpO2:  [95 %-100 %] 100 % (04/11 0800) Arterial Line BP: (90-147)/(48-73) 124/66 mmHg (04/11 0800) FiO2 (%):  [36 %-50 %] 36 % (04/10 1942) Weight:  [302 lb 11.1 oz (137.3 kg)] 302 lb 11.1 oz (137.3 kg) (04/11 0443)  Hemodynamic parameters for last 24 hours: PAP: (26-61)/(12-35) 58/35 mmHg CO:  [4.3 L/min-6.3 L/min] 6.3 L/min CI:  [1.8 L/min/m2-2.7 L/min/m2] 2.7 L/min/m2  Intake/Output from previous day: 04/10 0701 - 04/11 0700 In: 6568 [P.O.:720; I.V.:3958; Blood:800; NG/GT:30; IV Piggyback:1060] Out: 3505 [Urine:2910; Emesis/NG output:100; Chest Tube:495] Intake/Output this shift: Total I/O In: 44.6 [I.V.:44.6] Out: 30 [Urine:30]  General appearance: alert and no distress Neurologic: intact Heart: regular rate and rhythm Lungs: diminished breath sounds bibasilar Abdomen: obese, nontender, minimal BS + pericardial rub  Lab Results:  Basename 02/23/12 0410 02/22/12 2106 02/22/12 2100  WBC 23.9* -- 18.7*  HGB 12.1* 13.3 --  HCT 37.9* 39.0 --  PLT 128* -- 119*   BMET:  Basename 02/23/12 0410 02/22/12 2106 02/20/12 1113  NA 140 139 --  K 5.5* 5.9* --  CL 106 107 --  CO2 22 -- 28  GLUCOSE 130* 133* --  BUN 17 19 --  CREATININE 1.56* 1.30 --  CALCIUM 7.7* -- 9.3    PT/INR:  Basename 02/22/12 1510  LABPROT 17.4*  INR 1.40   ABG    Component Value Date/Time   PHART 7.341* 02/22/2012 1923   HCO3 24.7* 02/22/2012 1923   TCO2 25 02/22/2012 2106   ACIDBASEDEF 1.0 02/22/2012 1923   O2SAT 97.0 02/22/2012 1923   CBG  (last 3)   Basename 02/23/12 0708 02/23/12 0611 02/23/12 0507  GLUCAP 116* 114* 96    Assessment/Plan: S/P Procedure(s) (LRB): CORONARY ARTERY BYPASS GRAFTING (CABG) (N/A) POD # 1 CABG x 4 CV-  good cardiac output- d/c swan  Hypertensive- increase lopressor RESP- RUL atelectasis improved, but still some residual- add flutter  Continue albuterol and IS RENAL- Creatinine up- likely ATN from SIRS type reaction Leukocytosis- likely secondary to SIRS DM- CBG well controlled on insulin gtt- transition to lantus Thrombocytopenia- mild, follow Lovenox for dvt prophylaxis Mobilize   LOS: 1 day    Tri Chittick C 02/23/2012

## 2012-02-23 NOTE — Procedures (Signed)
Extubation Procedure Note  Patient Details:   Name: RANARD HARTE DOB: [birth year] MRN: [id number]   Airway Documentation:     Evaluation  O2 sats: stable throughout Complications: No apparent complications Patient did tolerate procedure well. Bilateral Breath Sounds: Expiratory wheezes Suctioning: Airway Yes  Pt. Was extubated to a 4L Octavia without any complications, dyspnea or stridor noted. Pt. Was instructed on IS X 5. Highest goal achieved was 500 mL. Pt. Achieved a -35 on NIF & 800 on VC. Pt. Is doing well at this time. All vitals are within normal limits.   Sheryle Hail 02/22/2012, 07:42 PM

## 2012-02-24 ENCOUNTER — Inpatient Hospital Stay (HOSPITAL_COMMUNITY): Payer: Medicare Other

## 2012-02-24 LAB — GLUCOSE, CAPILLARY: Glucose-Capillary: 109 mg/dL — ABNORMAL HIGH (ref 70–99)

## 2012-02-24 LAB — BASIC METABOLIC PANEL
BUN: 38 mg/dL — ABNORMAL HIGH (ref 6–23)
BUN: 46 mg/dL — ABNORMAL HIGH (ref 6–23)
Calcium: 8 mg/dL — ABNORMAL LOW (ref 8.4–10.5)
Chloride: 98 mEq/L (ref 96–112)
Creatinine, Ser: 2.75 mg/dL — ABNORMAL HIGH (ref 0.50–1.35)
Creatinine, Ser: 2.85 mg/dL — ABNORMAL HIGH (ref 0.50–1.35)
GFR calc Af Amer: 26 mL/min — ABNORMAL LOW (ref >90)
GFR calc non Af Amer: 23 mL/min — ABNORMAL LOW (ref >90)
GFR calc non Af Amer: 22 mL/min — ABNORMAL LOW (ref >90)
Glucose, Bld: 131 mg/dL — ABNORMAL HIGH (ref 70–99)
Potassium: 4.7 mEq/L (ref 3.5–5.1)

## 2012-02-24 LAB — CBC
MCHC: 31.5 g/dL (ref 30.0–36.0)
Platelets: 104 10*3/uL — ABNORMAL LOW (ref 150–400)
RDW: 14.4 % (ref 11.5–15.5)
WBC: 24.2 10*3/uL — ABNORMAL HIGH (ref 4.0–10.5)

## 2012-02-24 MED ORDER — ALBUMIN HUMAN 5 % IV SOLN
12.5000 g | Freq: Once | INTRAVENOUS | Status: AC
  Administered 2012-02-24: 12.5 g via INTRAVENOUS
  Filled 2012-02-24: qty 250

## 2012-02-24 MED ORDER — INSULIN ASPART 100 UNIT/ML ~~LOC~~ SOLN
0.0000 [IU] | Freq: Three times a day (TID) | SUBCUTANEOUS | Status: DC
  Administered 2012-02-24 – 2012-02-26 (×2): 3 [IU] via SUBCUTANEOUS

## 2012-02-24 MED ORDER — FUROSEMIDE 10 MG/ML IJ SOLN
80.0000 mg | Freq: Three times a day (TID) | INTRAMUSCULAR | Status: DC
  Administered 2012-02-24 – 2012-02-26 (×7): 80 mg via INTRAVENOUS
  Filled 2012-02-24 (×10): qty 8

## 2012-02-24 MED ORDER — INSULIN ASPART 100 UNIT/ML ~~LOC~~ SOLN: 0.0000 [IU] | Freq: Every day | SUBCUTANEOUS | Status: DC

## 2012-02-24 MED ORDER — SIMETHICONE 80 MG PO CHEW
80.0000 mg | CHEWABLE_TABLET | Freq: Four times a day (QID) | ORAL | Status: DC | PRN
  Administered 2012-02-24: 80 mg via ORAL
  Filled 2012-02-24: qty 1

## 2012-02-24 MED FILL — Lidocaine HCl IV Inj 20 MG/ML: INTRAVENOUS | Qty: 5 | Status: AC

## 2012-02-24 MED FILL — Sodium Chloride IV Soln 0.9%: INTRAVENOUS | Qty: 1000 | Status: AC

## 2012-02-24 MED FILL — Electrolyte-R (PH 7.4) Solution: INTRAVENOUS | Qty: 6000 | Status: AC

## 2012-02-24 MED FILL — Albumin, Human Inj 5%: INTRAVENOUS | Qty: 250 | Status: AC

## 2012-02-24 MED FILL — Sodium Chloride Irrigation Soln 0.9%: Qty: 3000 | Status: AC

## 2012-02-24 MED FILL — Sodium Bicarbonate IV Soln 8.4%: INTRAVENOUS | Qty: 50 | Status: AC

## 2012-02-24 MED FILL — Mannitol IV Soln 20%: INTRAVENOUS | Qty: 500 | Status: AC

## 2012-02-24 MED FILL — Heparin Sodium (Porcine) Inj 1000 Unit/ML: INTRAMUSCULAR | Qty: 30 | Status: AC

## 2012-02-24 MED FILL — Heparin Sodium (Porcine) Inj 1000 Unit/ML: INTRAMUSCULAR | Qty: 20 | Status: AC

## 2012-02-24 NOTE — Progress Notes (Signed)
2 Days Post-Op Procedure(s) (LRB): CORONARY ARTERY BYPASS GRAFTING (CABG) (N/A) Subjective: Some incisional pain, "but not much" No nausea, abdominal pain  Objective: Vital signs in last 24 hours: Temp:  [97.5 F (36.4 C)-98.8 F (37.1 C)] 98.3 F (36.8 C) (04/12 0400) Pulse Rate:  [76-113] 97  (04/12 0700) Cardiac Rhythm:  [-] Sinus tachycardia (04/12 0600) Resp:  [18-35] 18  (04/12 0700) BP: (90-118)/(29-70) 92/41 mmHg (04/12 0700) SpO2:  [86 %-100 %] 96 % (04/12 0700) Arterial Line BP: (105-124)/(60-78) 115/78 mmHg (04/11 1000) FiO2 (%):  [35 %-50 %] 35 % (04/12 0258) Weight:  [306 lb 14.1 oz (139.2 kg)] 306 lb 14.1 oz (139.2 kg) (04/12 0500)  Hemodynamic parameters for last 24 hours: PAP: (57-63)/(29-35) 57/29 mmHg CO:  [6.3 L/min] 6.3 L/min CI:  [2.7 L/min/m2] 2.7 L/min/m2  Intake/Output from previous day: 04/11 0701 - 04/12 0700 In: 1288.2 [P.O.:600; I.V.:580.2; IV Piggyback:108] Out: 785 [Urine:705; Chest Tube:80] Intake/Output this shift:    General appearance: alert and no distress Neurologic: intact Heart: regular rate and rhythm Lungs: diminished breath sounds bilaterally Abdomen: obese, nontender Extremities: edema 2+  Lab Results:  Basename 02/24/12 0400 02/23/12 1700  WBC 24.2* 26.2*  HGB 10.7* 11.2*  HCT 34.0* 35.6*  PLT 104* 120*   BMET:  Basename 02/24/12 0400 02/23/12 1700 02/23/12 1640 02/23/12 0410  NA 134* -- 138 --  K 5.0 -- 5.0 --  CL 98 -- 104 --  CO2 26 -- -- 22  GLUCOSE 142* -- 130* --  BUN 38* -- 29* --  CREATININE 2.75* 2.39* -- --  CALCIUM 8.0* -- -- 7.7*    PT/INR:  Basename 02/22/12 1510  LABPROT 17.4*  INR 1.40   ABG    Component Value Date/Time   PHART 7.341* 02/22/2012 1923   HCO3 24.7* 02/22/2012 1923   TCO2 25 02/23/2012 1640   ACIDBASEDEF 1.0 02/22/2012 1923   O2SAT 97.0 02/22/2012 1923   CBG (last 3)   Basename 02/24/12 0359 02/24/12 02/23/12 2002  GLUCAP 136* 130* 128*    Assessment/Plan: S/P  Procedure(s) (LRB): CORONARY ARTERY BYPASS GRAFTING (CABG) (N/A) POD # 2 CABG CV- BP a little soft this AM- will give albumin to increase intravascular volume RESP- bilateral areas of atelectasis, continue IS, flutter RENAL - Acute renal failure, likely ATN secondary to SIRS, continue dopamine, lasix 80 Q8, follow lytes, keep foley in place ENDO- CBGs well controlled, change to [facility] and HS DVT prophylaxis- on lovenox, mild thrombocytopenia, follow Leukocytosis- immediate postop, likely due to SIRS Mobilize   LOS: 2 days    HENDRICKSON,STEVEN C 02/24/2012

## 2012-02-24 NOTE — Progress Notes (Signed)
CSW received referral for SNF placement from [facility]. Reviewed chart, unable to assess as pt resting at time of visit. CSW will follow to facilitate d/c planning.  Baxter Flattery, MSW [phone]

## 2012-02-24 NOTE — Evaluation (Signed)
Physical Therapy Evaluation Patient Details Name: [name] MRN: [id number] DOB: Sep 20, 1946 Today's Date: 02/24/2012  Problem List:  Patient Active Problem List  Diagnoses  . CAD (coronary artery disease), native coronary artery  . Pure hypercholesterolemia  . Hypertension  . Tobacco abuse  . Morbid obesity    Past Medical History:  Past Medical History  Diagnosis Date  . Other malaise and fatigue   . Swelling of limb   . Edema   . Osteoarthrosis, unspecified whether generalized or localized, unspecified site   . Congestive heart failure, unspecified     EF 55-65% by cath 02/06/12  . CAD (coronary artery disease), native coronary artery 2006    Stent placed in 2006 in [place], [place]; Cath 02/06/12 Severe three-vessel coronary artery disease with critical stenosis in the mid LAD, severe stenosis of the left circumflex, and total occlusion of a small branch of the right coronary artery with moderate disease in the remaining portions of the RCA. Preserved LV function   . Pure hypercholesterolemia   . Hypertension   . Morbid obesity   . Tobacco abuse   . Myocardial infarction     DR COOPER   . Asthma     AS CHILD  . Shortness of breath     WITH EXERTION   . Diabetes mellitus    Past Surgical History:  Past Surgical History  Procedure Date  . Cardiac catheterization   . Coronary angioplasty with stent placement   . Coronary artery bypass graft 02/22/2012    Procedure: CORONARY ARTERY BYPASS GRAFTING (CABG);  Surgeon: Loreli Slot, MD;  Location: [facility] OR;  Service: Open Heart Surgery;  Laterality: N/A;  CABG x four;  using left internal mammary artery and right leg greater saphenous vein harvested endoscopically    PT Assessment/Plan/Recommendation PT Assessment Clinical Impression Statement: Patient s/p CABG x3 with decr mobility secondary to pain and poor endurance.  Will benefit from PT to address endurance and balance issues.  Needs to be independent on d/c  therefore may need short term NH stay.  Patient desat with activity today on RA.  Replaced  O2 at 3 L after treatment and O2 up to 92 % within 3 minutes.   PT Recommendation/Assessment: Patient will need skilled PT in the acute care venue PT Problem List: Decreased strength;Decreased activity tolerance;Decreased balance;Decreased mobility;Decreased safety awareness;Decreased knowledge of precautions;Decreased knowledge of use of DME Barriers to Discharge: Decreased caregiver support PT Therapy Diagnosis : Generalized weakness;Acute pain PT Plan PT Frequency: Min 3X/week PT Treatment/Interventions: DME instruction;Gait training;Functional mobility training;Therapeutic activities;Therapeutic exercise;Balance training;Patient/family education PT Recommendation Recommendations for Other Services: OT consult Follow Up Recommendations: Skilled nursing facility;Supervision/Assistance - 24 hour Equipment Recommended: Defer to next venue PT Goals  Acute Rehab PT Goals PT Goal Formulation: With patient Time For Goal Achievement: 2 weeks Pt will go Supine/Side to Sit: with modified independence PT Goal: Supine/Side to Sit - Progress: Goal set today Pt will Sit at [facility] of Bed: 3-5 min;with no upper extremity support;with modified independence PT Goal: Sit at Edge Of Bed - Progress: Goal set today Pt will go Sit to Stand: with modified independence;with upper extremity assist PT Goal: Sit to Stand - Progress: Goal set today Pt will Transfer Bed to Chair/Chair to Bed: with modified independence PT Transfer Goal: Bed to Chair/Chair to Bed - Progress: Goal set today Pt will Ambulate: 51 - 150 feet;with supervision;with least restrictive assistive device PT Goal: Ambulate - Progress: Goal set today Pt will Perform Home  Exercise Program: Independently PT Goal: Perform Home Exercise Program - Progress: Goal set today  PT Evaluation Precautions/Restrictions  Precautions Precautions:  Fall;Sternal Restrictions Weight Bearing Restrictions: No Prior Functioning  Home Living Lives With: Son Available Help at Discharge: Family Type of Home: House Home Access: Stairs to enter Secretary/administrator of Steps: 1 Home Layout: Two level;Able to live on main level with bedroom/bathroom;Full bath on main level Bathroom Shower/Tub: Tub/shower unit;Curtain Firefighter: Standard Prior Function Level of Independence: Independent Able to Take Stairs?: Reciprically Driving: Yes Vocation: Retired Financial risk analyst Arousal/Alertness: Awake/alert Overall Cognitive Status: Appears within functional limits for tasks assessed Orientation Level: Oriented X4 Sensation/Coordination Sensation Light Touch: Appears Intact Stereognosis: Not tested Hot/Cold: Not tested Proprioception: Not tested Coordination Gross Motor Movements are Fluid and Coordinated: Yes Fine Motor Movements are Fluid and Coordinated: Yes Extremity Assessment RUE Assessment RUE Assessment: Within Functional Limits LUE Assessment LUE Assessment: Within Functional Limits RLE Assessment RLE Assessment: Within Functional Limits LLE Assessment LLE Assessment: Within Functional Limits Mobility (including Balance) Bed Mobility Bed Mobility: No Transfers Transfers: Yes Sit to Stand: 1: +2 Total assist;Patient percentage (comment);Without upper extremity assist;From chair/3-in-1 (pt=60%) Sit to Stand Details (indicate cue type and reason): cues to use LEs and not hands for sternal precautions. Stand to Sit: 1: +2 Total assist;Patient percentage (comment);Without upper extremity assist;To chair/3-in-1;To elevated surface (pt =60%) Stand to Sit Details: assist to control descent and cues for sternal precautions Ambulation/Gait Ambulation/Gait: Yes Ambulation/Gait Assistance: 4: Min assist Ambulation/Gait Assistance Details (indicate cue type and reason): Followed patient with the chair.  Able to ambulate  with the RW in the hall - Needed cues to sequence steps and RW.  Somewhat wide BOS.  Cues to stay close to RW.  Patient did not overuse UEs.   Ambulation Distance (Feet): 100 Feet Assistive device: Rolling walker Gait Pattern: Step-through pattern;Shuffle;Decreased stride length Stairs: No Wheelchair Mobility Wheelchair Mobility: No  Posture/Postural Control Posture/Postural Control: No significant limitations    End of Session PT - End of Session Equipment Utilized During Treatment: Gait belt Activity Tolerance: Patient limited by fatigue;Patient limited by pain Patient left: in chair;with call bell in reach Nurse Communication: Mobility status for transfers;Mobility status for ambulation General Behavior During Session: [facility] for tasks performed Cognition: [facility] for tasks performed INGOLD,Onyekachi Gathright 02/24/2012, 12:39 PM  [facility] [phone] (pager)

## 2012-02-25 ENCOUNTER — Inpatient Hospital Stay (HOSPITAL_COMMUNITY): Payer: Medicare Other

## 2012-02-25 LAB — EXPECTORATED SPUTUM ASSESSMENT W GRAM STAIN, RFLX TO RESP C

## 2012-02-25 LAB — CBC
HCT: 29.9 % — ABNORMAL LOW (ref 39.0–52.0)
Hemoglobin: 9.7 g/dL — ABNORMAL LOW (ref 13.0–17.0)
MCHC: 32.4 g/dL (ref 30.0–36.0)
MCV: 89.5 fL (ref 78.0–100.0)
WBC: 17.9 10*3/uL — ABNORMAL HIGH (ref 4.0–10.5)

## 2012-02-25 LAB — BASIC METABOLIC PANEL
BUN: 51 mg/dL — ABNORMAL HIGH (ref 6–23)
Chloride: 97 mEq/L (ref 96–112)
Glucose, Bld: 118 mg/dL — ABNORMAL HIGH (ref 70–99)
Potassium: 4.5 mEq/L (ref 3.5–5.1)

## 2012-02-25 LAB — GLUCOSE, CAPILLARY

## 2012-02-25 MED ORDER — LACTULOSE 10 GM/15ML PO SOLN
30.0000 g | Freq: Every day | ORAL | Status: AC
  Administered 2012-02-25: 30 g via ORAL
  Filled 2012-02-25: qty 45

## 2012-02-25 MED ORDER — DEXTROSE 5 % IV SOLN
1.0000 g | Freq: Two times a day (BID) | INTRAVENOUS | Status: DC
  Administered 2012-02-25 – 2012-02-26 (×4): 1 g via INTRAVENOUS
  Filled 2012-02-25 (×5): qty 1

## 2012-02-25 MED ORDER — GUAIFENESIN ER 600 MG PO TB12
600.0000 mg | ORAL_TABLET | Freq: Two times a day (BID) | ORAL | Status: DC
  Administered 2012-02-25 – 2012-03-02 (×13): 600 mg via ORAL
  Filled 2012-02-25 (×15): qty 1

## 2012-02-25 NOTE — Progress Notes (Signed)
3 Days Post-Op Procedure(s) (LRB): CORONARY ARTERY BYPASS GRAFTING (CABG) (N/A) Subjective: CABG with posto[p renal failure Infiltrate vs atelectasis L base with inc WBC poss pneumonia CBGs controlled s Objective: Vital signs in last 24 hours: Temp:  [97.5 F (36.4 C)-98.7 F (37.1 C)] 97.6 F (36.4 C) (04/13 0809) Pulse Rate:  [93-108] 100  (04/13 0900) Cardiac Rhythm:  [-] Sinus tachycardia (04/13 0900) Resp:  [2-28] 19  (04/13 0900) BP: (94-113)/(36-74) 105/55 mmHg (04/13 0900) SpO2:  [60 %-97 %] 95 % (04/13 0900) Weight:  [306 lb 10.6 oz (139.1 kg)] 306 lb 10.6 oz (139.1 kg) (04/13 0500)  Hemodynamic parameters for last 24 hours:  stable  Intake/Output from previous day: 04/12 0701 - 04/13 0700 In: 1197.8 [P.O.:455; I.V.:664.8; IV Piggyback:78] Out: 1565 [Urine:1565] Intake/Output this shift: Total I/O In: 40 [I.V.:40] Out: 525 [Urine:525]  EXAM- incisions clean neuro intact  Lab Results:  Basename 02/25/12 0430 02/24/12 0400  WBC 17.9* 24.2*  HGB 9.7* 10.7*  HCT 29.9* 34.0*  PLT 93* 104*   BMET:  Basename 02/25/12 0430 02/24/12 1700  NA 133* 134*  K 4.5 4.7  CL 97 96  CO2 27 27  GLUCOSE 118* 131*  BUN 51* 46*  CREATININE 2.66* 2.85*  CALCIUM 8.0* 8.1*    PT/INR:  Basename 02/22/12 1510  LABPROT 17.4*  INR 1.40   ABG    Component Value Date/Time   PHART 7.341* 02/22/2012 1923   HCO3 24.7* 02/22/2012 1923   TCO2 25 02/23/2012 1640   ACIDBASEDEF 1.0 02/22/2012 1923   O2SAT 97.0 02/22/2012 1923   CBG (last 3)   Basename 02/25/12 0807 02/24/12 2029 02/24/12 1756  GLUCAP 113* 105* 100*    Assessment/Plan: S/P Procedure(s) (LRB): CORONARY ARTERY BYPASS GRAFTING (CABG) (N/A) Cont iv dopa- creat sl better 2.8 to 2.6   LOS: 3 days    VAN TRIGT III,Syriah Delisi 02/25/2012

## 2012-02-25 NOTE — Progress Notes (Signed)
SICU p.m. Rounds  CABG x5 with postop acute renal failure Urine output improving today Ambulated in the hallway greater than 100 feet No bowel movement yet but no nausea Maintaining sinus rhythm CBGs well-controlled less than 150

## 2012-02-26 ENCOUNTER — Inpatient Hospital Stay (HOSPITAL_COMMUNITY): Payer: Medicare Other

## 2012-02-26 DIAGNOSIS — I4891 Unspecified atrial fibrillation: Secondary | ICD-10-CM

## 2012-02-26 LAB — CBC
HCT: 29.4 % — ABNORMAL LOW (ref 39.0–52.0)
Hemoglobin: 9.4 g/dL — ABNORMAL LOW (ref 13.0–17.0)
MCH: 28.7 pg (ref 26.0–34.0)
MCHC: 32 g/dL (ref 30.0–36.0)
MCV: 89.9 fL (ref 78.0–100.0)
Platelets: 118 10*3/uL — ABNORMAL LOW (ref 150–400)
RBC: 3.27 MIL/uL — ABNORMAL LOW (ref 4.22–5.81)
RDW: 14.7 % (ref 11.5–15.5)
WBC: 11.7 10*3/uL — ABNORMAL HIGH (ref 4.0–10.5)

## 2012-02-26 LAB — COMPREHENSIVE METABOLIC PANEL
ALT: 35 U/L (ref 0–53)
AST: 56 U/L — ABNORMAL HIGH (ref 0–37)
Albumin: 2.5 g/dL — ABNORMAL LOW (ref 3.5–5.2)
Alkaline Phosphatase: 74 U/L (ref 39–117)
BUN: 56 mg/dL — ABNORMAL HIGH (ref 6–23)
CO2: 29 mEq/L (ref 19–32)
Calcium: 8.3 mg/dL — ABNORMAL LOW (ref 8.4–10.5)
Chloride: 97 mEq/L (ref 96–112)
Creatinine, Ser: 2.18 mg/dL — ABNORMAL HIGH (ref 0.50–1.35)
GFR calc Af Amer: 35 mL/min — ABNORMAL LOW (ref >90)
GFR calc non Af Amer: 30 mL/min — ABNORMAL LOW (ref >90)
Glucose, Bld: 107 mg/dL — ABNORMAL HIGH (ref 70–99)
Potassium: 4.3 mEq/L (ref 3.5–5.1)
Sodium: 135 mEq/L (ref 135–145)
Total Bilirubin: 0.4 mg/dL (ref 0.3–1.2)
Total Protein: 6.2 g/dL (ref 6.0–8.3)

## 2012-02-26 LAB — GLUCOSE, CAPILLARY: Glucose-Capillary: 101 mg/dL — ABNORMAL HIGH (ref 70–99)

## 2012-02-26 MED ORDER — DILTIAZEM HCL 100 MG IV SOLR
5.0000 mg/h | INTRAVENOUS | Status: DC
  Administered 2012-02-26: 7 mg/h via INTRAVENOUS
  Administered 2012-02-26: 5 mg/h via INTRAVENOUS
  Filled 2012-02-26: qty 100

## 2012-02-26 MED ORDER — AMIODARONE HCL IN DEXTROSE 360-4.14 MG/200ML-% IV SOLN
0.5000 mg/min | INTRAVENOUS | Status: DC
  Administered 2012-02-26: 0.5 mg/min via INTRAVENOUS
  Filled 2012-02-26 (×5): qty 200

## 2012-02-26 MED ORDER — DILTIAZEM HCL 100 MG IV SOLR
5.0000 mg/h | INTRAVENOUS | Status: DC
  Administered 2012-02-26: 5 mg/h via INTRAVENOUS
  Administered 2012-02-27: 10 mg/h via INTRAVENOUS
  Filled 2012-02-26 (×2): qty 100

## 2012-02-26 MED ORDER — FUROSEMIDE 10 MG/ML IJ SOLN
80.0000 mg | Freq: Two times a day (BID) | INTRAMUSCULAR | Status: AC
  Administered 2012-02-26 – 2012-02-27 (×3): 80 mg via INTRAVENOUS
  Filled 2012-02-26 (×4): qty 8

## 2012-02-26 MED ORDER — AMIODARONE HCL IN DEXTROSE 360-4.14 MG/200ML-% IV SOLN
1.0000 mg/min | INTRAVENOUS | Status: AC
  Administered 2012-02-26 (×2): 1 mg/min via INTRAVENOUS
  Filled 2012-02-26: qty 200

## 2012-02-26 MED ORDER — AMIODARONE LOAD VIA INFUSION
150.0000 mg | Freq: Once | INTRAVENOUS | Status: AC
  Administered 2012-02-26: 150 mg via INTRAVENOUS
  Filled 2012-02-26: qty 83.34

## 2012-02-26 NOTE — Plan of Care (Signed)
Problem: Phase III Progression Outcomes Goal: Dysrhythmias controlled Outcome: Not Progressing Pt into afib today, titrating medications Goal: Ambulates with pain/dyspnea controlled Outcome: Progressing Increased ambulation tolerance today, but still considerable SOB/wheezing with activity

## 2012-02-26 NOTE — Plan of Care (Signed)
Problem: Phase III Progression Outcomes Goal: Ambulates with pain/dyspnea controlled Outcome: Not Progressing PT consult

## 2012-02-26 NOTE — Progress Notes (Addendum)
4 Days Post-Op Procedure(s) (LRB): CORONARY ARTERY BYPASS GRAFTING (CABG) (N/A)                     [address]            [place] [address]          [phone]    Subjective:New onset a-fib, stable BP ambulated this am Excellent urine output w/ creat now 2.2 Objective: Vital signs in last 24 hours: Temp:  [98 F (36.7 C)-98.8 F (37.1 C)] 98.1 F (36.7 C) (04/14 0757) Pulse Rate:  [61-103] 61  (04/14 0900) Cardiac Rhythm:  [-] Atrial fibrillation (04/14 0900) Resp:  [14-29] 21  (04/14 0900) BP: (86-120)/(47-72) 104/67 mmHg (04/14 0900) SpO2:  [93 %-100 %] 96 % (04/14 0900) Weight:  [299 lb 13.2 oz (136 kg)] 299 lb 13.2 oz (136 kg) (04/14 0500)  Hemodynamic parameters for last 24 hours:  a-fib  Intake/Output from previous day: 04/13 0701 - 04/14 0700 In: 2281.8 [P.O.:1400; I.V.:747.8; IV Piggyback:134] Out: 4483 [Urine:4480; Stool:3] Intake/Output this shift: Total I/O In: 218.7 [I.V.:218.7] Out: 530 [Urine:530]  Alert and comfortable abd soft  Lab Results:  Basename 02/26/12 0445 02/25/12 0430  WBC 11.7* 17.9*  HGB 9.4* 9.7*  HCT 29.4* 29.9*  PLT 118* 93*   BMET:  Basename 02/26/12 0445 02/25/12 0430  NA 135 133*  K 4.3 4.5  CL 97 97  CO2 29 27  GLUCOSE 107* 118*  BUN 56* 51*  CREATININE 2.18* 2.66*  CALCIUM 8.3* 8.0*    PT/INR: No results found for this basename: LABPROT,INR in the last 72 hours ABG    Component Value Date/Time   PHART 7.341* 02/22/2012 1923   HCO3 24.7* 02/22/2012 1923   TCO2 25 02/23/2012 1640   ACIDBASEDEF 1.0 02/22/2012 1923   O2SAT 97.0 02/22/2012 1923   CBG (last 3)   Basename 02/26/12 0734 02/26/12 0331 02/25/12 2129  GLUCAP 107* 99 107*    Assessment/Plan: S/P Procedure(s) (LRB): CORONARY ARTERY BYPASS GRAFTING (CABG) (N/A) PLAN- add IV cardizem to amio and decrease lasix dosing, WBC improve cont Fortaz   LOS: 4 days    VAN TRIGT III,Dontrelle Mazon 02/26/2012

## 2012-02-26 NOTE — Progress Notes (Signed)
Patient examined and record reviewed.Hemodynamics stable,labs satisfactory.Patient had stable day.Continue current care.  Increase cardizem drip to 10mg  VAN TRIGT III,Eldine Rencher 02/26/2012

## 2012-02-27 ENCOUNTER — Inpatient Hospital Stay (HOSPITAL_COMMUNITY): Payer: Medicare Other

## 2012-02-27 LAB — CBC
HCT: 27.3 % — ABNORMAL LOW (ref 39.0–52.0)
Hemoglobin: 9 g/dL — ABNORMAL LOW (ref 13.0–17.0)
MCH: 28.9 pg (ref 26.0–34.0)
MCHC: 33 g/dL (ref 30.0–36.0)
MCV: 87.8 fL (ref 78.0–100.0)
Platelets: 134 10*3/uL — ABNORMAL LOW (ref 150–400)
RBC: 3.11 MIL/uL — ABNORMAL LOW (ref 4.22–5.81)
RDW: 14.6 % (ref 11.5–15.5)
WBC: 11 10*3/uL — ABNORMAL HIGH (ref 4.0–10.5)

## 2012-02-27 LAB — CULTURE, RESPIRATORY W GRAM STAIN: Culture: NORMAL

## 2012-02-27 LAB — BASIC METABOLIC PANEL
BUN: 52 mg/dL — ABNORMAL HIGH (ref 6–23)
CO2: 29 mEq/L (ref 19–32)
Calcium: 8.3 mg/dL — ABNORMAL LOW (ref 8.4–10.5)
Chloride: 95 mEq/L — ABNORMAL LOW (ref 96–112)
Creatinine, Ser: 2.05 mg/dL — ABNORMAL HIGH (ref 0.50–1.35)
GFR calc Af Amer: 37 mL/min — ABNORMAL LOW (ref >90)
GFR calc non Af Amer: 32 mL/min — ABNORMAL LOW (ref >90)
Glucose, Bld: 108 mg/dL — ABNORMAL HIGH (ref 70–99)
Potassium: 4.2 mEq/L (ref 3.5–5.1)
Sodium: 132 mEq/L — ABNORMAL LOW (ref 135–145)

## 2012-02-27 LAB — GLUCOSE, CAPILLARY: Glucose-Capillary: 89 mg/dL (ref 70–99)

## 2012-02-27 MED ORDER — SODIUM CHLORIDE 0.9 % IJ SOLN
3.0000 mL | Freq: Two times a day (BID) | INTRAMUSCULAR | Status: DC
  Administered 2012-02-28 – 2012-03-01 (×5): 3 mL via INTRAVENOUS

## 2012-02-27 MED ORDER — LEVALBUTEROL HCL 0.63 MG/3ML IN NEBU
0.6300 mg | INHALATION_SOLUTION | Freq: Four times a day (QID) | RESPIRATORY_TRACT | Status: DC | PRN
  Filled 2012-02-27: qty 3

## 2012-02-27 MED ORDER — MOVING RIGHT ALONG BOOK
Freq: Once | Status: AC
  Administered 2012-02-27: 22:00:00
  Filled 2012-02-27: qty 1

## 2012-02-27 MED ORDER — FUROSEMIDE 80 MG PO TABS
80.0000 mg | ORAL_TABLET | Freq: Every day | ORAL | Status: DC
  Administered 2012-02-28 – 2012-03-02 (×4): 80 mg via ORAL
  Filled 2012-02-27 (×4): qty 1

## 2012-02-27 MED ORDER — ALPRAZOLAM 0.25 MG PO TABS
0.2500 mg | ORAL_TABLET | Freq: Four times a day (QID) | ORAL | Status: DC | PRN
  Administered 2012-02-29: 0.25 mg via ORAL
  Filled 2012-02-27: qty 1

## 2012-02-27 MED ORDER — SODIUM CHLORIDE 0.9 % IJ SOLN: 3.0000 mL | INTRAMUSCULAR | Status: DC | PRN

## 2012-02-27 MED ORDER — ZOLPIDEM TARTRATE 5 MG PO TABS
5.0000 mg | ORAL_TABLET | Freq: Every evening | ORAL | Status: DC | PRN
  Administered 2012-02-29: 5 mg via ORAL
  Filled 2012-02-27: qty 1

## 2012-02-27 MED ORDER — AMIODARONE HCL 200 MG PO TABS
400.0000 mg | ORAL_TABLET | Freq: Two times a day (BID) | ORAL | Status: DC
  Administered 2012-02-27 – 2012-03-02 (×9): 400 mg via ORAL
  Filled 2012-02-27 (×10): qty 2

## 2012-02-27 MED ORDER — SODIUM CHLORIDE 0.9 % IV SOLN: 250.0000 mL | INTRAVENOUS | Status: DC | PRN

## 2012-02-27 MED FILL — Magnesium Sulfate Inj 50%: INTRAMUSCULAR | Qty: 10 | Status: AC

## 2012-02-27 MED FILL — Potassium Chloride Inj 2 mEq/ML: INTRAVENOUS | Qty: 40 | Status: AC

## 2012-02-27 NOTE — Progress Notes (Signed)
Physical Therapy Treatment Patient Details Name: [name] MRN: [id number] DOB: 06-10-1946 Today's Date: 02/27/2012  PT Assessment/Plan  PT - Assessment/Plan Comments on Treatment Session: Pt s/p CABG progressing with ambulation and mobility today. Pt benefits from encouragement, reassurance and direct goal for ambulation distance. Pt encouraged to continue ambulating and performing HEP with nursing. Pt able to recall 1/3 sternal precautions and provided education for all 3.  PT Plan: Discharge plan remains appropriate Follow Up Recommendations: Skilled nursing facility PT Goals  Acute Rehab PT Goals PT Goal: Supine/Side to Sit - Progress: Progressing toward goal PT Goal: Sit at [facility] Of Bed - Progress: Progressing toward goal PT Goal: Sit to Stand - Progress: Progressing toward goal PT Transfer Goal: Bed to Chair/Chair to Bed - Progress: Progressing toward goal PT Goal: Ambulate - Progress: Progressing toward goal PT Goal: Perform Home Exercise Program - Progress: Progressing toward goal  PT Treatment Precautions/Restrictions  Precautions Precautions: Sternal;Fall Restrictions Weight Bearing Restrictions: No Mobility (including Balance) Bed Mobility Bed Mobility: Yes Rolling Left: 3: Mod assist Rolling Left Details (indicate cue type and reason): cueing for sequence and to facilitate pelvic rotation Left Sidelying to Sit: 3: Mod assist;HOB flat Left Sidelying to Sit Details (indicate cue type and reason): facilitation at trunk with cueing for bil LE positioning and movement off the EOB Sitting - Scoot to Edge of Bed: 4: Min assist Sitting - Scoot to [place] of Bed Details (indicate cue type and reason): cueing for reciprocally scooting and lack of pushing with arms Transfers Sit to Stand: 4: Min assist Sit to Stand Details (indicate cue type and reason): cueing for hand placement and sequence Stand to Sit: 5: Supervision;To chair/3-in-1 Stand to Sit Details: cueing for hands  on thighs Ambulation/Gait Ambulation/Gait Assistance: 4: Min assist Ambulation/Gait Assistance Details (indicate cue type and reason): minguard with cueing to step into RW and for positioning in RW, pt with one standing rest but did not require being followed by chair Ambulation Distance (Feet): 120 Feet Assistive device: Rolling walker Gait Pattern: Step-through pattern;Decreased stride length Stairs: No  Posture/Postural Control Posture/Postural Control: No significant limitations Exercise  General Exercises - Lower Extremity Long Arc Quad: AROM;Both;15 reps;Seated Hip Flexion/Marching: AROM;Both;Other reps (comment);Seated (15reps) Toe Raises: AROM;Both;15 reps;Seated End of Session PT - End of Session Equipment Utilized During Treatment: Gait belt Activity Tolerance: Patient tolerated treatment well Patient left: in chair;with call bell in reach Nurse Communication: Mobility status for transfers;Mobility status for ambulation General Behavior During Session: [facility] for tasks performed Cognition: [facility] for tasks performed  Delorse Lek 02/27/2012, 2:52 PM Delaney Meigs, PT [phone]

## 2012-02-27 NOTE — Progress Notes (Signed)
Clinical Social Work Department CLINICAL SOCIAL WORK PLACEMENT NOTE 02/27/2012  Patient:  [name], [name]  Account Number:  [id number] Admit date:  02/22/2012  Clinical Social Worker:  Baxter Flattery, LCSWA  Date/time:  02/27/2012 12:00 N  Clinical Social Work is seeking post-discharge placement for this patient at the following level of care:   SKILLED NURSING   (*CSW will update this form in Epic as items are completed)   02/27/2012  Patient/family provided with [facility] Department of Clinical Social Work's list of facilities offering this level of care within the geographic area requested by the patient (or if unable, by the patient's family).  02/27/2012  Patient/family informed of their freedom to choose among providers that offer the needed level of care, that participate in Medicare, Medicaid or managed care program needed by the patient, have an available bed and are willing to accept the patient.  02/27/2012  Patient/family informed of [facility]' ownership interest in [facility], as well as of the fact that they are under no obligation to receive care at this facility.  PASARR submitted to EDS on 02/27/2012 PASARR number received from EDS on   FL2 transmitted to all facilities in geographic area requested by pt/family on  02/27/2012 FL2 transmitted to all facilities within larger geographic area on   Patient informed that his/her managed care company has contracts with or will negotiate with  certain facilities, including the following:     Patient/family informed of bed offers received:   Patient chooses bed at  Physician recommends and patient chooses bed at    Patient to be transferred to  on   Patient to be transferred to facility by   The following physician request were entered in Epic:   Additional Comments: Preference for [facility] in [place]

## 2012-02-27 NOTE — Progress Notes (Signed)
Pt transferred to 2016 via wheelchair and without difficulty. Pt settled in room, placed on tele monitor, receiving RN with patient and stated she didn't need anything further from me.

## 2012-02-27 NOTE — Progress Notes (Signed)
Clinical Social Work Department BRIEF PSYCHOSOCIAL ASSESSMENT 02/27/2012  Patient:  [name], [name]     Account Number:  [id number]     Admit date:  02/22/2012  Clinical Social Worker:  Mee Hives  Date/Time:  02/27/2012 12:00 N  Referred by:  Care Management  Date Referred:  02/24/2012 Referred for  SNF Placement   Other Referral:   Interview type:  Patient Other interview type:   CSW also spoke with pt son by phone    PSYCHOSOCIAL DATA Living Status:  WITH MINOR CHILDREN Admitted from facility:   Level of care:   Primary support name:  [name] Primary support relationship to patient:  CHILD, ADULT Degree of support available:   Strong, pt states he has much support from family and friends nearby.    CURRENT CONCERNS Current Concerns  Post-Acute Placement   Other Concerns:    SOCIAL WORK ASSESSMENT / PLAN Pt requesting short term rehab placement at Skilled Nursing Facility at time of d/c as he does not have 24 hour care available. Pt 48 year old son is staying with friends at present.   Assessment/plan status:  Information/Referral to [facility] Other assessment/ plan:   SNF placement   Information/referral to community resources:    PATIENT'S/FAMILY'S RESPONSE TO PLAN OF CARE: Pt receptive and appreciative of CSW intervention

## 2012-02-27 NOTE — Progress Notes (Signed)
5 Days Post-Op Procedure(s) (LRB): CORONARY ARTERY BYPASS GRAFTING (CABG) (N/A) Subjective: Feels well this AM No pain, nausea or SOB  Objective: Vital signs in last 24 hours: Temp:  [97.5 F (36.4 C)-98.1 F (36.7 C)] 97.5 F (36.4 C) (04/15 0748) Pulse Rate:  [61-113] 101  (04/15 0600) Cardiac Rhythm:  [-] Atrial fibrillation (04/14 2000) Resp:  [18-28] 26  (04/15 0700) BP: (91-128)/(47-83) 92/66 mmHg (04/15 0700) SpO2:  [81 %-100 %] 94 % (04/15 0600) Weight:  [298 lb 15.1 oz (135.6 kg)] 298 lb 15.1 oz (135.6 kg) (04/15 0500)  Hemodynamic parameters for last 24 hours:    Intake/Output from previous day: 04/14 0701 - 04/15 0700 In: 1864.7 [P.O.:740; I.V.:1016.7; IV Piggyback:108] Out: 3495 [Urine:3495] Intake/Output this shift:    General appearance: alert and no distress Heart: irregularly irregular rhythm Lungs: diminished breath sounds bibasilar and faint wheezes Abdomen: normal findings: soft, non-tender Wound: clean and dry  Lab Results:  Basename 02/27/12 0400 02/26/12 0445  WBC 11.0* 11.7*  HGB 9.0* 9.4*  HCT 27.3* 29.4*  PLT 134* 118*   BMET:  Basename 02/27/12 0400 02/26/12 0445  NA 132* 135  K 4.2 4.3  CL 95* 97  CO2 29 29  GLUCOSE 108* 107*  BUN 52* 56*  CREATININE 2.05* 2.18*  CALCIUM 8.3* 8.3*    PT/INR: No results found for this basename: LABPROT,INR in the last 72 hours ABG    Component Value Date/Time   PHART 7.341* 02/22/2012 1923   HCO3 24.7* 02/22/2012 1923   TCO2 25 02/23/2012 1640   ACIDBASEDEF 1.0 02/22/2012 1923   O2SAT 97.0 02/22/2012 1923   CBG (last 3)   Basename 02/27/12 0746 02/26/12 2210 02/26/12 1702  GLUCAP 89 101* 109*    Assessment/Plan: S/P Procedure(s) (LRB): CORONARY ARTERY BYPASS GRAFTING (CABG) (N/A) Plan for transfer to step-down: see transfer orders CV- remains in rate controlled a fib- continue amiodarone, lopressor and diltiazem, if persists another r 24 hours will start coumadin  Resp- pulmonary toilet,  still a little wet on CXR  Renal- lytes OK, creatinine improving, continue diuresis  Endo- CBG well controlled  DVT prophylaxis- lovenox   LOS: 5 days    Lew Prout C 02/27/2012

## 2012-02-28 LAB — CBC
MCH: 29.1 pg (ref 26.0–34.0)
MCHC: 32.6 g/dL (ref 30.0–36.0)
Platelets: 162 10*3/uL (ref 150–400)
RBC: 3.23 MIL/uL — ABNORMAL LOW (ref 4.22–5.81)
RDW: 15 % (ref 11.5–15.5)

## 2012-02-28 LAB — BASIC METABOLIC PANEL
CO2: 34 mEq/L — ABNORMAL HIGH (ref 19–32)
Calcium: 8.6 mg/dL (ref 8.4–10.5)
Creatinine, Ser: 1.83 mg/dL — ABNORMAL HIGH (ref 0.50–1.35)
GFR calc Af Amer: 43 mL/min — ABNORMAL LOW (ref >90)
GFR calc non Af Amer: 37 mL/min — ABNORMAL LOW (ref >90)
Sodium: 138 mEq/L (ref 135–145)

## 2012-02-28 LAB — GLUCOSE, CAPILLARY
Glucose-Capillary: 93 mg/dL (ref 70–99)
Glucose-Capillary: 105 mg/dL — ABNORMAL HIGH (ref 70–99)

## 2012-02-28 NOTE — Progress Notes (Signed)
Physical Therapy Treatment Patient Details Name: [name] MRN: [id number] DOB: 12/24/1945 Today's Date: 02/28/2012  PT Assessment/Plan  PT - Assessment/Plan Comments on Treatment Session: Pt s/p CABG progressing with strength and safety with all mobility. Pt able to recall 2/3 precatuions, but continues to require verbal cues for safe mobility. Pt did not require rest break during gait today, but needs cues for breathing technique. VSS throughout tx.  PT Plan: Discharge plan remains appropriate PT Frequency: Min 3X/week Follow Up Recommendations: Skilled nursing facility Equipment Recommended: Defer to next venue PT Goals  Acute Rehab PT Goals PT Goal: Sit to Stand - Progress: Progressing toward goal PT Transfer Goal: Bed to Chair/Chair to Bed - Progress: Progressing toward goal PT Goal: Ambulate - Progress: Progressing toward goal PT Goal: Perform Home Exercise Program - Progress: Progressing toward goal  PT Treatment Precautions/Restrictions  Precautions Precautions: Sternal;Fall Restrictions Weight Bearing Restrictions: No Mobility (including Balance) Bed Mobility Rolling Left: 4: Min assist Rolling Left Details (indicate cue type and reason): Verbal cues for sequence and technique as pt began coming to sit without rolling Left Sidelying to Sit: 4: Min assist Left Sidelying to Sit Details (indicate cue type and reason): Lifting assist at trunk with cues for timing LEs off bed  Sitting - Scoot to Edge of Bed: 5: Supervision Sitting - Scoot to Edge of Bed Details (indicate cue type and reason): Cues only needed for scooting without UEs Transfers Sit to Stand: 5: Supervision;From elevated surface;From chair/3-in-1;From bed Sit to Stand Details (indicate cue type and reason): Pt lightly placing hands on RW. Pt did not pull on RW. Min A for steadying only Stand to Sit: 5: Supervision;To chair/3-in-1;To bed Stand to Sit Details: Verbal cues for hands on  thighs Ambulation/Gait Ambulation/Gait Assistance: 4: Min assist (Progressed to close S) Ambulation/Gait Assistance Details (indicate cue type and reason): Min-guard>S with cues to stay close to RW with upright posture. Pt did not require rest this tx, but was with increased SOB on exretion. Cues for proper breathing in through nose. VSS throughout.  Ambulation Distance (Feet): 120 Feet Assistive device: Rolling walker Gait Pattern: Step-through pattern;Decreased stride length (Lateral sway with wide BOS) Stairs: No Wheelchair Mobility Wheelchair Mobility: No    Exercise  General Exercises - Lower Extremity Ankle Circles/Pumps: Seated;20 reps;Both;Strengthening;AROM Long Arc Quad: AROM;Both;15 reps;Seated Hip Flexion/Marching: Standing;10 reps;Both;Strengthening;AROM Mini-Sqauts: Standing;10 reps;Both;Strengthening;AROM (Pt SOB following ex. HR+93bpm) End of Session PT - End of Session Equipment Utilized During Treatment: Gait belt Activity Tolerance: Patient tolerated treatment well Patient left: in chair;with call bell in reach (commode chair, nursing aware and checking on pt) Nurse Communication: Mobility status for transfers;Mobility status for ambulation General Behavior During Session: [facility] for tasks performed Cognition: [facility] for tasks performed  Virl Cagey, [place] [phone]  02/28/2012, 9:28 AM

## 2012-02-28 NOTE — Progress Notes (Signed)
Assessed patient's ambulation status and he did not want to complete a third walk.  Patient said his big toe on his left foot hurt and he did not want his foot to give out due to a gout flare up. He stated that he had a history of gout and had taken medicine for it but could not remember the name of it when I asked. Will continue to monitor.

## 2012-02-28 NOTE — Plan of Care (Signed)
Problem: Phase III Progression Outcomes Goal: Time patient transferred to PCTU/Telemetry POD Outcome: Completed/Met Date Met:  02/28/12 tx 02/27/12 2200

## 2012-02-28 NOTE — Progress Notes (Addendum)
[address]            [place] [address]          [phone]     6 Days Post-Op  Procedure(s) (LRB): CORONARY ARTERY BYPASS GRAFTING (CABG) (N/A) Subjective: Feeling well overall  Objective  Telemetry SR  Temp:  [98.1 F (36.7 C)-98.4 F (36.9 C)] 98.1 F (36.7 C) (04/15 2005) Pulse Rate:  [34-80] 74  (04/16 0549) Resp:  [14-26] 22  (04/16 0549) BP: (100-136)/(47-97) 136/66 mmHg (04/16 0549) SpO2:  [90 %-100 %] 100 % (04/16 0549) Weight:  [291 lb 14.2 oz (132.4 kg)] 291 lb 14.2 oz (132.4 kg) (04/16 0549)   Intake/Output Summary (Last 24 hours) at 02/28/12 0754 Last data filed at 02/28/12 0707  Gross per 24 hour  Intake 1296.8 ml  Output   4350 ml  Net -3053.2 ml       General appearance: alert, cooperative and no distress Heart: regular rate and rhythm and S1, S2 normal Lungs: diminished in bases Abdomen: soft, non tender Extremities: + edema L>R LE Wound: incisions healing well  Lab Results:  Basename 02/28/12 0505 02/27/12 0400  NA 138 132*  K 4.2 4.2  CL 96 95*  CO2 34* 29  GLUCOSE 97 108*  BUN 50* 52*  CREATININE 1.83* 2.05*  CALCIUM 8.6 8.3*  MG -- --  PHOS -- --    Basename 02/26/12 0445  AST 56*  ALT 35  ALKPHOS 74  BILITOT 0.4  PROT 6.2  ALBUMIN 2.5*   No results found for this basename: LIPASE:2,AMYLASE:2 in the last 72 hours  Basename 02/28/12 0505 02/27/12 0400  WBC 10.7* 11.0*  NEUTROABS -- --  HGB 9.4* 9.0*  HCT 28.8* 27.3*  MCV 89.2 87.8  PLT 162 134*   No results found for this basename: CKTOTAL:4,CKMB:4,TROPONINI:4 in the last 72 hours No components found with this basename: POCBNP:3 No results found for this basename: DDIMER in the last 72 hours No results found for this basename: HGBA1C in the last 72 hours No results found for this basename: CHOL,HDL,LDLCALC,TRIG,CHOLHDL in the last 72 hours No results found for this basename: TSH,T4TOTAL,FREET3,T3FREE,THYROIDAB in the last 72  hours No results found for this basename: VITAMINB12,FOLATE,FERRITIN,TIBC,IRON,RETICCTPCT in the last 72 hours  Medications: Scheduled    . acetaminophen  1,000 mg Oral Q6H   Or  . acetaminophen (TYLENOL) oral liquid 160 mg/5 mL  975 mg Per Tube Q6H  . amiodarone  400 mg Oral BID  . aspirin EC  325 mg Oral Daily   Or  . aspirin  324 mg Per Tube Daily  . atorvastatin  40 mg Oral Daily  . bisacodyl  10 mg Oral Daily   Or  . bisacodyl  10 mg Rectal Daily  . docusate sodium  200 mg Oral Daily  . enoxaparin  40 mg Subcutaneous QHS  . furosemide  80 mg Intravenous BID  . furosemide  80 mg Oral Daily  . guaiFENesin  600 mg Oral BID  . insulin aspart  0-20 Units Subcutaneous TID WC  . insulin aspart  0-5 Units Subcutaneous QHS  . insulin aspart  4 Units Subcutaneous TID WC  . insulin glargine  35 Units Subcutaneous BH-q7a  . metoprolol tartrate  25 mg Oral BID   Or  . metoprolol tartrate  25 mg Per Tube BID  . moving right along book  Does not apply Once  . pantoprazole  40 mg Oral Q1200  . sodium chloride  3 mL Intravenous Q12H  . DISCONTD: albuterol  2.5 mg Nebulization Q6H  . DISCONTD: cefTAZidime (FORTAZ)  IV  1 g Intravenous Q12H  . DISCONTD: sodium chloride  3 mL Intravenous Q12H     Radiology/Studies:  Dg Chest Port 1 View  02/27/2012  *RADIOLOGY REPORT*  Clinical Data: Postop.  CABG.  PORTABLE CHEST - 1 VIEW  Comparison: 02/26/2012.  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  No pneumothorax is evident.  There is stable moderate cardiac silhouette enlargement. The patient has undergone previous median sternotomy and coronary artery bypass grafting. There is mild vascular congestion.  Atelectasis and infiltrates are present in both lung bases.  There is a small right pleural effusion.  IMPRESSION: Venous sheath unchanged in position.  No pneumothorax.  Stable moderate cardiac  silhouette enlargement.  Mild vascular congestion pattern.  Bibasilar atelectasis and infiltrates without significant change.  Small right pleural effusion.  Original Report Authenticated By: Crawford Givens, M.D.    INR: Will add last result for INR, ABG once components are confirmed Will add last 4 CBG results once components are confirmed  Assessment/Plan: S/P Procedure(s) (LRB): CORONARY ARTERY BYPASS GRAFTING (CABG) (N/A)  1. Doing well 2. Cont diuresis, pulm toilet, rehab 3. Renal fxn improving 4. In sinus since on 2000, cont current rhythm rx 5. Lovenox for DVT proph. 6. Sugars well controlled 74-104 7. On fortaz, WBC improved  LOS: 6 days    GOLD,WAYNE E 4/16/20137:54 AM    Looks good, no complaints Will probably need short term rehab/ SNF prior to going home Should be ready by end of week

## 2012-02-28 NOTE — Progress Notes (Signed)
CARDIAC REHAB PHASE I   PRE:  Rate/Rhythm: 79SR  BP:  Supine: 113/65  Sitting:   Standing:    SaO2: 97%3L  MODE:  Ambulation: 150 ft   POST:  Rate/Rhythem: 95SR  BP:  Supine:   Sitting: 150/75  Standing:    SaO2: 90-94%3L 1325-1355 Pt walked 150 ft on 3L with rolling walker and asst x 2. Stopped once to rest. Tired by end of walk. To recliner after walk. Left on 3L. Checked sats in hall at 94%.  Duanne Limerick

## 2012-02-29 LAB — GLUCOSE, CAPILLARY
Glucose-Capillary: 105 mg/dL — ABNORMAL HIGH (ref 70–99)
Glucose-Capillary: 110 mg/dL — ABNORMAL HIGH (ref 70–99)

## 2012-02-29 MED ORDER — COLCHICINE 0.6 MG PO TABS
0.6000 mg | ORAL_TABLET | Freq: Two times a day (BID) | ORAL | Status: DC
  Administered 2012-02-29 – 2012-03-02 (×5): 0.6 mg via ORAL
  Filled 2012-02-29 (×6): qty 1

## 2012-02-29 NOTE — Progress Notes (Addendum)
7 Days Post-Op Procedure(s) (LRB): CORONARY ARTERY BYPASS GRAFTING (CABG) (N/A) Subjective:  [name] states that he is having an episodic flare up of his gout.  He states that he is not currently receiving his gout medication.    Objective: Vital signs in last 24 hours: Temp:  [97 F (36.1 C)-98.6 F (37 C)] 98.2 F (36.8 C) (04/17 0443) Pulse Rate:  [60-85] 74  (04/17 0443) Cardiac Rhythm:  [-] Normal sinus rhythm;Other (Comment) (04/17 0745) Resp:  [21-22] 21  (04/17 0443) BP: (102-117)/(44-65) 111/44 mmHg (04/17 0443) SpO2:  [99 %-100 %] 100 % (04/17 0443) Weight:  [291 lb 3.6 oz (132.1 kg)] 291 lb 3.6 oz (132.1 kg) (04/17 0443)    Intake/Output from previous day: 04/16 0701 - 04/17 0700 In: 480 [P.O.:480] Out: 4176 [Urine:4175; Stool:1]  General appearance: alert, cooperative, no distress and morbidly obese Heart: regular rate and rhythm Lungs: clear to auscultation bilaterally Abdomen: soft, non-tender; bowel sounds normal; no masses,  no organomegaly Extremities: edema 1+ Wound: incisions clean and dry  Lab Results:  Basename 02/28/12 0505 02/27/12 0400  WBC 10.7* 11.0*  HGB 9.4* 9.0*  HCT 28.8* 27.3*  PLT 162 134*   BMET:  Basename 02/28/12 0505 02/27/12 0400  NA 138 132*  K 4.2 4.2  CL 96 95*  CO2 34* 29  GLUCOSE 97 108*  BUN 50* 52*  CREATININE 1.83* 2.05*  CALCIUM 8.6 8.3*    PT/INR: No results found for this basename: LABPROT,INR in the last 72 hours ABG    Component Value Date/Time   PHART 7.341* 02/22/2012 1923   HCO3 24.7* 02/22/2012 1923   TCO2 25 02/23/2012 1640   ACIDBASEDEF 1.0 02/22/2012 1923   O2SAT 97.0 02/22/2012 1923   CBG (last 3)   Basename 02/29/12 0609 02/28/12 2152 02/28/12 1628  GLUCAP 105* 105* 74    Assessment/Plan: S/P Procedure(s) (LRB): CORONARY ARTERY BYPASS GRAFTING (CABG) (N/A)  2. CV- patient remains in NSR, rate and blood pressure controlled with Amiodarone and Metoprolol- will d/c EPW today 3. Resp- no  shortness of breath, continue to wean oxygen as tolerated, continue IS 4. Gout- patient states having a flare up, will restart home Colchicine 5. DM- CBGs well controlled 6. Dispo- will D/C EPW today, continue DVT prophylaxis, patient is doing well will likely be ready for d/c to SNF in the next 24-48 hours   LOS: 7 days    BARRETT, ERIN 02/29/2012  Patient seen and examined. Agree with above

## 2012-02-29 NOTE — Progress Notes (Signed)
Patient's Oxygen weaned from 3L to 2L. Will continue to monitor.

## 2012-02-29 NOTE — Progress Notes (Signed)
Patient shown Recovering from Heart Surgery video. Will continue to monitor.

## 2012-02-29 NOTE — Progress Notes (Signed)
CSW working to secure SNF bed for this pt in [place]. Tentative placement at [facility], but pt will need 30 day note [place]. MD PLEASE SIGN 30 DAY NOTE IN SHADOW CHART.  CSW will continue to follow.   Baxter Flattery, MSW [phone]

## 2012-02-29 NOTE — Progress Notes (Signed)
CARDIAC REHAB PHASE I   PRE:  Rate/Rhythm: 68SR  BP:  Supine:   Sitting: 112/66  Standing:    SaO2: 98%2L   91-92%RA  MODE:  Ambulation: 150 ft   POST:  Rate/Rhythem: 87SR  BP:  Supine:   Sitting: 144/73  Standing:    SaO2: 96%2L 1200-1225 Pt willing to try to walk with left great toe gout. Able to walk 150 ft with rolling walker and asst x 1., but toe was painful per pt. Was going to try RA to walk but pt c/o feeling SOB in room with RA. Sat at 91-92%. Walked on 2L and pt was dyspneic during walk. Back to chair after walk. Set up pt's lunch.   Duanne Limerick

## 2012-03-01 LAB — GLUCOSE, CAPILLARY
Glucose-Capillary: 114 mg/dL — ABNORMAL HIGH (ref 70–99)
Glucose-Capillary: 103 mg/dL — ABNORMAL HIGH (ref 70–99)

## 2012-03-01 MED ORDER — METOPROLOL TARTRATE 25 MG PO TABS: 25.0000 mg | ORAL_TABLET | Freq: Two times a day (BID) | ORAL | Status: DC

## 2012-03-01 MED ORDER — AMIODARONE HCL 400 MG PO TABS: 400.0000 mg | ORAL_TABLET | Freq: Two times a day (BID) | ORAL | Status: DC

## 2012-03-01 MED ORDER — ASPIRIN 325 MG PO TBEC: 325.0000 mg | DELAYED_RELEASE_TABLET | Freq: Every day | ORAL | Status: AC

## 2012-03-01 MED ORDER — GUAIFENESIN ER 600 MG PO TB12: 600.0000 mg | ORAL_TABLET | Freq: Two times a day (BID) | ORAL | Status: AC

## 2012-03-01 MED ORDER — OXYCODONE HCL 5 MG PO TABS: 5.0000 mg | ORAL_TABLET | ORAL | Status: AC | PRN

## 2012-03-01 NOTE — Progress Notes (Addendum)
8 Days Post-Op Procedure(s) (LRB): CORONARY ARTERY BYPASS GRAFTING (CABG) (N/A) Subjective:  [name] continues to complain of discomfort due to his gout.  He also complains of some occasional shortness of breath.  He remains on oxygen this morning.  Objective: Vital signs in last 24 hours: Temp:  [97.5 F (36.4 C)-99.1 F (37.3 C)] 99.1 F (37.3 C) (04/18 0346) Pulse Rate:  [66-77] 77  (04/18 0346) Cardiac Rhythm:  [-] Normal sinus rhythm;Other (Comment) (04/17 1935) Resp:  [20-24] 24  (04/18 0346) BP: (104-139)/(46-87) 133/46 mmHg (04/18 0346) SpO2:  [93 %-100 %] 93 % (04/18 0346) Weight:  [293 lb 14 oz (133.3 kg)] 293 lb 14 oz (133.3 kg) (04/18 0346)  Intake/Output from previous day: 04/17 0701 - 04/18 0700 In: 960 [P.O.:960] Out: 2325 [Urine:2325]   General appearance: alert, cooperative and no distress Heart: regular rate and rhythm Lungs: wheezes RUL and CTA bilaterally Abdomen: soft, non-tender; bowel sounds normal; no masses,  no organomegaly Extremities: edema 1+ Wound: clean and dry  Lab Results:  Basename 02/28/12 0505  WBC 10.7*  HGB 9.4*  HCT 28.8*  PLT 162   BMET:  Basename 02/28/12 0505  NA 138  K 4.2  CL 96  CO2 34*  GLUCOSE 97  BUN 50*  CREATININE 1.83*  CALCIUM 8.6    PT/INR: No results found for this basename: LABPROT,INR in the last 72 hours ABG    Component Value Date/Time   PHART 7.341* 02/22/2012 1923   HCO3 24.7* 02/22/2012 1923   TCO2 25 02/23/2012 1640   ACIDBASEDEF 1.0 02/22/2012 1923   O2SAT 97.0 02/22/2012 1923   CBG (last 3)   Basename 03/01/12 0556 02/29/12 2112 02/29/12 1622  GLUCAP 114* 142* 89    Assessment/Plan: S/P Procedure(s) (LRB): CORONARY ARTERY BYPASS GRAFTING (CABG) (N/A)  2. CV- remains in NSR this morning, will continue Metoprolol and Amiodarone 3. Resp- wheezes on right, occasional dyspnea, continue to wean oxygen as tolerated, aggressive [place] of I/S 4. Gout- patient started on Colchicine yesterday 5.  DMs- good control of CBGs 6. Deconditioning- will cont PT/OT 7. Dispo- to SNF once bed available     LOS: 8 days    [name] 03/01/2012   Patient seen and examined. Agree with above. To SNF when bed available

## 2012-03-01 NOTE — Discharge Summary (Signed)
Physician Discharge Summary  Patient ID: [name] MRN: [id number] DOB/AGE: [birth year] 66 y.o.  Admit date: 02/22/2012 Discharge date: 03/02/2012  Admission Diagnoses: 1. History of coronary artery disease (s/p MI,status post PTCA with stent in 2006) 2. History of hypercholesterolemia 3. History of hypertension 4. History of diabetes mellitus 5. History of morbid obesity 6. History of CHF 7. History of tobacco abuse 8.History of gout  Discharge Diagnoses:  1. History of coronary artery disease (s/p MI,status post PTCA with stent in 2006) 2. History of hypercholesterolemia 3. History of hypertension 4. History of diabetes mellitus 5. History of morbid obesity 6. History of CHF 7. History of tobacco abuse 8.History of gout 9.Post op afib (converted to SR) 10.ABL anemia 11.Acute renal insufficiency   Procedure (s): Median sternotomy, extracorporeal circulation, coronary  bypass grafting x4 (left internal mammary artery to left anterior  descending artery, saphenous vein graft to posterior descending,  saphenous vein graft to first diagonal, saphenous vein graft to obtuse  marginal 1), endoscopic vein harvest, right leg by Dr. Dorris Fetch on 02/23/2012.  History of Presenting Illness: This is a 66 yo male with history of CAD (s/p PTCA/ stent in [place] in 2006) who has been on aspirin and Plavix since that time. He is morbidly obese, continues to smoke, and has HTN and hypercholesterolemia. He has been having chest pain for about a year. This usually is exertional, relieved with rest, and he has not had to take NTG. He says his chest pain has awakened him from sleep a couple of times, but not recently. He had a cardiolite which showed significant ischemia and was scheduled for catheterization by Dr. Excell Seltzer on 02/06/2012 . He was found to have 3 vessel CAD and LVEF is estimated at 55-65%.A cardiothoracic consultation was obtained on this admission with Dr. Dorris Fetch. He discussed  with the patient the necessitation for coronary bypass grafting surgery.Pre-operative carotid ultrasound showed no significant internal carotid artery stenosis bilaterally. ABIs were 1.11 on the right and 1.1 on the left. Potential risks, complications, benefits of the surgery discussed with the patient and he agreed to proceed. Patient was discharged in stable condition on 02/07/2012 and he [facility] to [facility] on [facility]/22/2011 in order to undergo his coronary bypass grafting surgery.   Brief Hospital Course:   He was extubated successfully early the evening of surgery.He remained afebrile and hemodynamic is stable. His Swan-Ganz, A-line, chest tubes, and Foley were all removed early his postoperative course.He was placed on a low-dose beta blocker and this was titrated accordingly. He initially had mild thrombocytopenia but this did resolve. His last platelet count was up to 162,000. He also initially had  acute renal insufficiency. He did remain on a dopamine drip for a couple of days postoperatively.This was thought likely a combination of ATN from SIRS type reaction.Gradually, his creatinine did improve and his last creatinine was down to 1.83. He continued to progress with physical therapy. He then developed atrial fibrillation with RVR. He was placed on Amiodarone and Cardizem. He did convert to sinus rhythm. Diuresis was initiated and his creatnine was monitored closely. He continued to have leukocytosis postoperatively.Gradually, his white blood cell count did decrease to 11,000. He had no signs of wound infection and he did remained afebrile. He was felt surgically stable for transfer from the intensive care unit to [facility] for further convalescence on 02/27/2012. As previously stated, he has a history of diabetes mellitus. His HGA1C preoperatively was 6.4. He was not on any oral medications  prior to this admission. Because  his creatinine remained elevated postoperatively, he was not placed on  any oral medications. His glucose was well-controlled with insulin. He has been requiring  a couple liters oxygen via nasal nasal cannula. Hopefully, he will be weaned off his oxygen prior to discharge. He will require followup as an outpatient.  He has already been tolerating a diet and has had a bowel movement. Epicardial pacing wires and chest tube sutures will be removed. Provided he remains afebrile, and in the stable, and pending morning round evaluation, he will be surgically stable for discharge on 03/02/2012. Latest Vital Signs: Blood pressure 133/46, pulse 77, temperature 99.1 F (37.3 C), temperature source Oral, resp. rate 24, height 5\' 6"  (1.676 m), weight 293 lb 14 oz (133.3 kg), SpO2 93.00%.  Physical Exam: General appearance: alert, cooperative and no distress  Heart: regular rate and rhythm  Lungs: wheezes RUL and CTA bilaterally  Abdomen: soft, non-tender; bowel sounds normal; no masses, no organomegaly  Extremities: edema 1+  Wound: clean and dry  Discharge Condition:Stable  Recent laboratory studies:  Lab Results  Component Value Date   WBC 10.7* 02/28/2012   HGB 9.4* 02/28/2012   HCT 28.8* 02/28/2012   MCV 89.2 02/28/2012   PLT 162 02/28/2012   Lab Results  Component Value Date   NA 138 02/28/2012   K 4.2 02/28/2012   CL 96 02/28/2012   CO2 34* 02/28/2012   CREATININE 1.83* 02/28/2012   GLUCOSE 97 02/28/2012      Diagnostic Studies: Dg Orthopantogram  02/06/2012  *RADIOLOGY REPORT*  Clinical Data: 66 year old male with coronary artery disease - preoperative exam for CABG.  ORTHOPANTOGRAM/PANORAMIC  Comparison: None  Findings: Multiple missing teeth are noted. Numerous caries are identified in scattered upper and lower teeth. Periapical lucency/abscesses identified of tooth #3 and what appears to be tooth #20.  IMPRESSION: Multiple dental caries and periapical abscesses as described.  Original Report Authenticated By: Rosendo Gros, M.D.   Dg Chest Port 1  View  02/27/2012  *RADIOLOGY REPORT*  Clinical Data: Postop.  CABG.  PORTABLE CHEST - 1 VIEW  Comparison: 02/26/2012.  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  No pneumothorax is evident.  There is stable moderate cardiac silhouette enlargement. The patient has undergone previous median sternotomy and coronary artery bypass grafting. There is mild vascular congestion.  Atelectasis and infiltrates are present in both lung bases.  There is a small right pleural effusion.  IMPRESSION: Venous sheath unchanged in position.  No pneumothorax.  Stable moderate cardiac silhouette enlargement.  Mild vascular congestion pattern.  Bibasilar atelectasis and infiltrates without significant change.  Small right pleural effusion.  Original Report Authenticated By: Crawford Givens, M.D.      Discharge Orders    Future Appointments: Provider: Department: Dept Phone: Center:   03/21/2012 10:30 AM Loreli Slot, MD [facility] [phone] [facility]      Discharge Medications: Medication List  As of 03/01/2012 11:13 AM   STOP taking these medications         clopidogrel 75 MG tablet      lisinopril 10 MG tablet      nicotine 21 mg/24hr patch      nitroGLYCERIN 0.4 MG SL tablet         TAKE these medications         amiodarone 400 MG tablet   Commonly known as: PACERONE  Take 1 tablet (400 mg total) by mouth 2 (two) times daily. For 6 days then take Amiodarone 400 mg po daily thereafter.      aspirin 325 MG EC tablet   Take 1 tablet (325 mg total) by mouth daily.      atorvastatin 40 MG tablet   Commonly known as: LIPITOR   Take 40 mg by mouth daily.      chlorhexidine 0.12 % solution   Commonly known as: PERIDEX   Use as directed 15 mLs in the mouth or throat 2 (two) times daily. Use after breakfast and before bed daily      colchicine 0.6 MG tablet   Take 0.6 mg by mouth 2 (two) times daily as  needed. For gout flare      furosemide 40 MG tablet   Commonly known as: LASIX   Take 40 mg by mouth daily.      guaiFENesin 600 MG 12 hr tablet   Commonly known as: MUCINEX   Take 1 tablet (600 mg total) by mouth 2 (two) times daily. For cough      metoprolol tartrate 25 MG tablet   Commonly known as: LOPRESSOR   Take 1 tablet (25 mg total) by mouth 2 (two) times daily.      oxyCODONE 5 MG immediate release tablet   Commonly known as: Oxy IR/ROXICODONE   Take 1-2 tablets (5-10 mg total) by mouth every 3 (three) hours as needed.            Follow Up Appointments: Follow-up Information    Follow up with Rishaan Gunner C, MD. (PA/LAT CXR to be taken at [facility] (same building as Dr. Sunday Corn office) on 03/21/2012 at 9:30 am;Appointment with Dr. Dorris Fetch is on 03/21/2012 at 10:30 am.)    Contact information:   [address] [place] [address] [phone]       Follow up with Medical Doctor. (Call for a follow up regarding HGA1C of 6.4)          Signed: ZIMMERMAN,DONIELLE MPA-C 03/01/2012, 11:13 AM

## 2012-03-01 NOTE — Progress Notes (Signed)
Physical Therapy Treatment Patient Details Name: [name] MRN: [id number] DOB: Jan 14, 1946 Today's Date: 03/01/2012  PT Assessment/Plan  PT - Assessment/Plan Comments on Treatment Session: Pt s/p CABG continues progressing with mobility and only able to state 1/3 precautions. Pt educated for precautions, transfers and incentive spirometer use and demonstrated IS x 5. Pt with one standing rest break and O2 95 throughout amb on RA. PT Plan: Discharge plan remains appropriate PT Goals  Acute Rehab PT Goals PT Goal: Sit at Edge Of Bed - Progress: Progressing toward goal PT Goal: Sit to Stand - Progress: Progressing toward goal PT Transfer Goal: Bed to Chair/Chair to Bed - Progress: Progressing toward goal Pt will Ambulate: >150 feet;with least restrictive assistive device;with supervision PT Goal: Ambulate - Progress: Updated due to goal met PT Goal: Perform Home Exercise Program - Progress: Progressing toward goal  PT Treatment Precautions/Restrictions  Precautions Precautions: Sternal;Fall Restrictions Weight Bearing Restrictions: No Mobility (including Balance) Bed Mobility Bed Mobility: No Transfers Sit to Stand: 5: Supervision;From chair/3-in-1 Sit to Stand Details (indicate cue type and reason): cueing for hand placement and safety Stand to Sit: 5: Supervision;To chair/3-in-1 Stand to Sit Details: cueing for hand placement and safety Ambulation/Gait Ambulation/Gait Assistance: 5: Supervision Ambulation/Gait Assistance Details (indicate cue type and reason): cueing to step into RW and for breathing technique throughout. Pt with tachypnea and cueing to slow rate and increase depth. Ambulation Distance (Feet): 240 Feet Assistive device: Rolling walker Gait Pattern: Step-through pattern;Decreased stride length (wide BOS) Stairs: No  Posture/Postural Control Posture/Postural Control: No significant limitations Exercise  General Exercises - Lower Extremity Ankle  Circles/Pumps: AROM;Both;15 reps;Seated Long Arc Quad: AROM;Both;20 reps;Seated Hip Flexion/Marching: AROM;Both;Other reps (comment);Seated (20reps) End of Session PT - End of Session Equipment Utilized During Treatment: Gait belt Activity Tolerance: Patient limited by fatigue Patient left: in chair;with call bell in reach Nurse Communication: Mobility status for transfers;Mobility status for ambulation General Behavior During Session: [facility] for tasks performed Cognition: [facility] for tasks performed  Delorse Lek 03/01/2012, 4:56 PM Delaney Meigs, PT [phone]

## 2012-03-01 NOTE — Progress Notes (Signed)
CARDIAC REHAB PHASE I   PRE:  Rate/Rhythm: 77SR  BP:  Supine:   Sitting: 122/60  Standing:    SaO2: 96%1.5L, 96%RA  MODE:  Ambulation: 260 ft   POST:  Rate/Rhythem: 98 SR  BP:  Supine:   Sitting: 159/76  Standing:    SaO2: checked several times during walk 92-94%RA 1010-1048 Pt walked 260 ft on RA with rolling walker and asst x 1. Very dyspneic, almost hyperventilating. Encouraged pt to try to purse-lip breathe. Checked sats frequently and they were 92-94%RA. Pt sat in chair in hallway to rest at about halfway. Encouraged pt to slow breathing. Still with some pain that he states is gout in his left great toe and left knee. To recliner after walk. Put back on oxygen for comfort. Encouraged IS.  Duanne Limerick

## 2012-03-01 NOTE — Discharge Instructions (Signed)
Activity: 1.May walk up steps                2.No lifting more than ten pounds for four weeks.                 3.No driving for four weeks.                4.Stop any activity that causes chest pain, shortness of breath, dizziness, sweating or excessive weakness.                5.Avoid straining.                6.Continue with your breathing exercises daily.  Diet: Diabetic diet and Low fat, Low salt diet  Wound Care: May shower.  Clean wounds with mild soap and water daily. Contact the office at [phone] if any problems arise.  Coronary Artery Bypass Grafting Care After Refer to this sheet in the next few weeks. These instructions provide you with information on caring for yourself after your procedure. Your caregiver may also give you more specific instructions. Your treatment has been planned according to current medical practices, but problems sometimes occur. Call your caregiver if you have any problems or questions after your procedure.  Recovery from open heart surgery will be different for everyone. Some people feel well after 3 or 4 weeks, while for others it takes longer. After heart surgery, it may be normal to:  Not have an appetite, feel nauseated by the smell of food, or only want to eat a small amount.   Be constipated because of changes in your diet, activity, and medicines. Eat foods high in fiber. Add fresh fruits and vegetables to your diet. Stool softeners may be helpful.   Feel sad or unhappy. You may be frustrated or cranky. You may have good days and bad days. Do not give up. Talk to your caregiver if you do not feel better.   Feel weakness and fatigue. You many need physical therapy or cardiac rehabilitation to get your strength back.   Develop an irregular heartbeat called atrial fibrillation. Symptoms of atrial fibrillation are a fast, irregular heartbeat or feelings of fluttery heartbeats, shortness of breath, low blood pressure, and dizziness. If these symptoms  develop, see your caregiver right away.  MEDICATION  Have a list of all the medicines you will be taking when you leave the hospital. For every medicine, know the following:   Name.   Exact dose.   Time of day to be taken.   How often it should be taken.   Why you are taking it.   Ask which medicines should or should not be taken together. If you take more than one heart medicine, ask if it is okay to take them together. Some heart medicines should not be taken at the same time because they may lower your blood pressure too much.   Narcotic pain medicine can cause constipation. Eat fresh fruits and vegetables. Add fiber to your diet. Stool softener medicine may help relieve constipation.   Keep a copy of your medicines with you at all times.   Do not add or stop taking any medicine until you check with your caregiver.   Medicines can have side effects. Call your caregiver who prescribed the medicine if you:   Start throwing up, have diarrhea, or have stomach pain.   Feel dizzy or lightheaded when you stand up.   Feel your heart is skipping beats or is   beating too fast or too slow.   Develop a rash.   Notice unusual bruising or bleeding.  HOME CARE INSTRUCTIONS  After heart surgery, it is important to learn how to take your pulse. Have your caregiver show you how to take your pulse.   Use your incentive spirometer. Ask your caregiver how long after surgery you need to use it.  Care of your chest incision  Tell your caregiver right away if you notice clicking in your chest (sternum).   Support your chest with a pillow or your arms when you take deep breaths and cough.   Follow your caregiver's instructions about when you can bathe or swim.   Protect your incision from sunlight during the first year to keep the scar from getting dark.   Tell your caregiver if you notice:   Increased tenderness of your incision.   Increased redness or swelling around your incision.     Drainage or pus from your incision.  Care of your leg incision(s)  Avoid crossing your legs.   Avoid sitting for long periods of time. Change positions every half hour.   Elevate your leg(s) when you are sitting.   Check your leg(s) daily for swelling. Check the incisions for redness or drainage.   Wear your elastic stockings as told by your caregiver. Take them off at bedtime.  Diet  Diet is very important to heart health.   Eat plenty of fresh fruits and vegetables. Meats should be lean cut. Avoid canned, processed, and fried foods.   Talk to a dietician. They can teach you how to make healthy food and drink choices.  Weight  Weigh yourself every day. This is important because it helps to know if you are retaining fluid that may make your heart and lungs work harder.   Use the same scale each time.   Weigh yourself every morning at the same time. You should do this after you go to the bathroom, but before you eat breakfast.   Your weight will be more accurate if you do not wear any clothes.   Record your weight.   Tell your caregiver if you have gained 2 pounds or more overnight.  Activity Stop any activity at once if you have chest pain, shortness of breath, irregular heartbeats, or dizziness. Get help right away if you have any of these symptoms.  Bathing.  Avoid soaking in a bath or hot tub until your incisions are healed.   Rest. You need a balance of rest and activity.   Exercise. Exercise per your caregiver's advice. You may need physical therapy or cardiac rehabilitation to help strengthen your muscles and build your endurance.   Climbing stairs. Unless your caregiver tells you not to climb stairs, go up stairs slowly and rest if you tire. Do not pull yourself up by the handrail.   Driving a car. Follow your caregiver's advice on when you may drive. You may ride as a passenger at any time. When traveling for long periods of time in a car, get out of the car and  walk around for a few minutes every 2 hours.   Lifting. Avoid lifting, pushing, or pulling anything heavier than 10 pounds for 6 weeks after surgery or as told by your caregiver.   Returning to work. Check with your caregiver. People heal at different rates. Most people will be able to go back to work 6 to 12 weeks after surgery.   Sexual activity. You may resume sexual relations   as told by your caregiver.  SEEK MEDICAL CARE IF:  Any of your incisions are red, painful, or have any type of drainage coming from them.   You have an oral temperature above 102 F (38.9 C).   You have ankle or leg swelling.   You have pain in your legs.   You have weight gain of 2 or more pounds a day.   You feel dizzy or lightheaded when you stand up.  SEEK IMMEDIATE MEDICAL CARE IF:  You have angina or chest pain that goes to your jaw or arms. Call your local emergency services right away.   You have shortness of breath at rest or with activity.   You have a fast or irregular heartbeat (arrhythmia).   There is a "clicking" in your sternum when you move.   You have numbness or weakness in your arms or legs.  MAKE SURE YOU:  Understand these instructions.   Will watch your condition.   Will get help right away if you are not doing well or get worse.  Document Released: 05/20/2005 Document Revised: 10/20/2011 Document Reviewed: 01/05/2011 [employer] Patient Information 2012 [employer].    

## 2012-03-01 NOTE — Progress Notes (Signed)
Clinical Therapist, occupational and notified [facility].  [name] is able to accept pt tomorrow.  CSW notified family and provided contact information of family to SNF.   Angelia Mould, MSW, [place] [phone]

## 2012-03-01 NOTE — Progress Notes (Signed)
Clinical Social Worker met with pt at bedside; son available by pt's cell phone through speaker.  CSW introduced self and explained coverage for [facility] CSW, Cora Passanisi.  CSW confirmed plan for SNF and discussed [facility] requesting additional information.   CSW provided opportunity for son/pt to process concerns/questions related to SNF placement.    CSW submitted requested information to [facility]; 30 day note, signed FL2, H&P, and reason pt is requesting out of state placement in [place].   [name], MSW, LCSWA [phone] 

## 2012-03-02 LAB — GLUCOSE, CAPILLARY: Glucose-Capillary: 89 mg/dL (ref 70–99)

## 2012-03-02 NOTE — Progress Notes (Signed)
[facility] called for transport. Pt agreeable to d/c plan. CSW signing off.  Baxter Flattery, MSW [phone]

## 2012-03-02 NOTE — Progress Notes (Signed)
[address]            [place] [address]          [phone]     9 Days Post-Op  Procedure(s) (LRB): CORONARY ARTERY BYPASS GRAFTING (CABG) (N/A) Subjective: Feels better each day, gout sx have resolved  Objective  Telemetry sinus rhythm   Temp:  [98 F (36.7 C)-98.4 F (36.9 C)] 98.3 F (36.8 C) (04/19 0453) Pulse Rate:  [60-82] 60  (04/19 0453) Resp:  [22-24] 24  (04/19 0453) BP: (109-134)/(63-71) 114/63 mmHg (04/19 0453) SpO2:  [92 %-98 %] 98 % (04/19 0453) Weight:  [293 lb 12.8 oz (133.267 kg)] 293 lb 12.8 oz (133.267 kg) (04/19 0453)   Intake/Output Summary (Last 24 hours) at 03/02/12 0745 Last data filed at 03/02/12 0458  Gross per 24 hour  Intake    600 ml  Output   1600 ml  Net  -1000 ml       General appearance: alert, cooperative and no distress Heart: regular rate and rhythm and S1, S2 normal Lungs: mildly diminished in the bases Abdomen: obese, soft, nontender Extremities: + edema BLE , but improved Wound: incisions healing well  Lab Results: No results found for this basename: NA:2,K:2,CL:2,CO2:2,GLUCOSE:2,BUN:2,CREATININE:2,CALCIUM:2,MG:2,PHOS:2 in the last 72 hours No results found for this basename: AST:2,ALT:2,ALKPHOS:2,BILITOT:2,PROT:2,ALBUMIN:2 in the last 72 hours No results found for this basename: LIPASE:2,AMYLASE:2 in the last 72 hours No results found for this basename: WBC:2,NEUTROABS:2,HGB:2,HCT:2,MCV:2,PLT:2 in the last 72 hours No results found for this basename: CKTOTAL:4,CKMB:4,TROPONINI:4 in the last 72 hours No components found with this basename: POCBNP:3 No results found for this basename: DDIMER in the last 72 hours No results found for this basename: HGBA1C in the last 72 hours No results found for this basename: CHOL,HDL,LDLCALC,TRIG,CHOLHDL in the last 72 hours No results found for this basename: TSH,T4TOTAL,FREET3,T3FREE,THYROIDAB in the last 72 hours No results found for this basename:  VITAMINB12,FOLATE,FERRITIN,TIBC,IRON,RETICCTPCT in the last 72 hours  Medications: Scheduled    . amiodarone  400 mg Oral BID  . aspirin EC  325 mg Oral Daily   Or  . aspirin  324 mg Per Tube Daily  . atorvastatin  40 mg Oral Daily  . bisacodyl  10 mg Oral Daily   Or  . bisacodyl  10 mg Rectal Daily  . colchicine  0.6 mg Oral BID  . docusate sodium  200 mg Oral Daily  . enoxaparin  40 mg Subcutaneous QHS  . furosemide  80 mg Oral Daily  . guaiFENesin  600 mg Oral BID  . insulin aspart  0-20 Units Subcutaneous TID WC  . insulin aspart  0-5 Units Subcutaneous QHS  . insulin aspart  4 Units Subcutaneous TID WC  . insulin glargine  35 Units Subcutaneous [id number]  . metoprolol tartrate  25 mg Oral BID   Or  . metoprolol tartrate  25 mg Per Tube BID  . pantoprazole  40 mg Oral Q1200  . sodium chloride  3 mL Intravenous Q12H     Radiology/Studies:  No results found.  INR: Will add last result for INR, ABG once components are confirmed Will add last 4 CBG results once components are confirmed  Assessment/Plan: S/P Procedure(s) (LRB): CORONARY ARTERY BYPASS GRAFTING (CABG) (N/A)  1. Doing well 2. CBG well controlled 3. Acute gout resolved clinically 4. D/c to snf 4. Cont rehab/pulm toilet  LOS: 9 days  Iola Turri E 4/19/20137:45 AM

## 2012-03-02 NOTE — Progress Notes (Signed)
Cardiac Rehab [phone] Education completed with pt. Permission given to refer to [facility] Phase 2. Reenforced NO smoking. Pt stated that would not be a problem. Encouraged adherence to diabetic diet and encouraged weight loss. Cordarro Spinnato DunlapRN

## 2012-03-02 NOTE — Progress Notes (Signed)
Clinical Social Work Department CLINICAL SOCIAL WORK PLACEMENT NOTE 03/02/2012  Patient:  [name], [name]  Account Number:  [id number] Admit date:  02/22/2012  Clinical Social Worker:  Baxter Flattery, LCSWA  Date/time:  02/27/2012 12:00 N  Clinical Social Work is seeking post-discharge placement for this patient at the following level of care:   SKILLED NURSING   (*CSW will update this form in Epic as items are completed)   02/27/2012  Patient/family provided with [facility] Department of Clinical Social Work's list of facilities offering this level of care within the geographic area requested by the patient (or if unable, by the patient's family).  02/27/2012  Patient/family informed of their freedom to choose among providers that offer the needed level of care, that participate in Medicare, Medicaid or managed care program needed by the patient, have an available bed and are willing to accept the patient.  02/27/2012  Patient/family informed of [facility]' ownership interest in [facility], as well as of the fact that they are under no obligation to receive care at this facility.  PASARR submitted to EDS on 02/27/2012 PASARR number received from EDS on 02/28/2012  FL2 transmitted to all facilities in geographic area requested by pt/family on  02/27/2012 FL2 transmitted to all facilities within larger geographic area on   Patient informed that his/her managed care company has contracts with or will negotiate with  certain facilities, including the following:   [facility]     Patient/family informed of bed offers received:  02/28/2012 Patient chooses bed at [facility] Physician recommends and patient chooses bed at    Patient to be transferred to [facility] on  03/02/2012 Patient to be transferred to facility by ptar  The following physician request were entered in Epic:   Additional Comments: Preference for [facility] in  [place]

## 2012-03-02 NOTE — Progress Notes (Signed)
Plan for d/c to [facility] today. RN advised. Pt will be transferred by [facility]. CSW preparing chart. Facility is ready to accept pt for admission.   Baxter Flattery, MSW [phone]

## 2012-03-06 ENCOUNTER — Telehealth: Payer: Self-pay

## 2012-03-06 NOTE — Telephone Encounter (Signed)
Advised nurse to continue to monitor for signs of infection at [facility] site, clean with soap and water.keep dry, elevated legs. Fax over order to remove the 1 chest tube site suture, clean with soap and water, keep dry and monitor for signs of infection, Appt schedule with Dr Dorris Fetch on 03/21/12.

## 2012-03-08 ENCOUNTER — Telehealth: Payer: Self-pay

## 2012-03-08 NOTE — Telephone Encounter (Signed)
The pt's also has a small opening at one of the Chest tube sites, NO drainage NO redness, No fever. The nurse at [facility] does not think there is any infection at incision sites. He is not having his blood glucose levels check at facility or does not have appt with PCP ( Dr Sherryll Burger) or Cardiologist (Dr Kirke Corin). Because of his rapid wt increase I recommended to send pt to ED and notified Dr Sherryll Burger.

## 2012-03-13 ENCOUNTER — Other Ambulatory Visit: Payer: Self-pay | Admitting: Thoracic Surgery (Cardiothoracic Vascular Surgery)

## 2012-03-13 DIAGNOSIS — I2510 Atherosclerotic heart disease of native coronary artery without angina pectoris: Secondary | ICD-10-CM

## 2012-03-14 ENCOUNTER — Encounter: Payer: Self-pay | Admitting: Thoracic Surgery (Cardiothoracic Vascular Surgery)

## 2012-03-14 ENCOUNTER — Ambulatory Visit
Admission: RE | Admit: 2012-03-14 | Discharge: 2012-03-14 | Disposition: A | Discharge status: Home / Self Care | Payer: No Typology Code available for payment source | Source: Ambulatory Visit | Attending: Thoracic Surgery (Cardiothoracic Vascular Surgery) | Admitting: Thoracic Surgery (Cardiothoracic Vascular Surgery)

## 2012-03-14 ENCOUNTER — Ambulatory Visit (INDEPENDENT_AMBULATORY_CARE_PROVIDER_SITE_OTHER): Payer: Self-pay | Admitting: Thoracic Surgery (Cardiothoracic Vascular Surgery)

## 2012-03-14 VITALS — BP 139/66 | HR 54 | Resp 18 | Ht 66.0 in | Wt 300.0 lb

## 2012-03-14 DIAGNOSIS — Z951 Presence of aortocoronary bypass graft: Secondary | ICD-10-CM

## 2012-03-14 DIAGNOSIS — I2510 Atherosclerotic heart disease of native coronary artery without angina pectoris: Secondary | ICD-10-CM

## 2012-03-14 NOTE — Progress Notes (Signed)
HPI: [name] returns for a scheduled followup visit. He had coronary bypass grafting x4 on April 10. In postoperative course was marked by atrial fibrillation which converted to sinus rhythm with amiodarone. He also had some postoperative renal dysfunction which returned to baseline by the time of discharge. He was discharged to the [facility] rehabilitation center. He's been working with physical therapy there. He says that overall he feels well and is happy with his progress. He has had more swelling in his legs recently. He's not having much pain.  Past Medical History  Diagnosis Date  . Other malaise and fatigue   . Swelling of limb   . Edema   . Osteoarthrosis, unspecified whether generalized or localized, unspecified site   . Congestive heart failure, unspecified     EF 55-65% by cath 02/06/12  . CAD (coronary artery disease), native coronary artery 2006    Stent placed in 2006 in [place], [place]; Cath 02/06/12 Severe three-vessel coronary artery disease with critical stenosis in the mid LAD, severe stenosis of the left circumflex, and total occlusion of a small branch of the right coronary artery with moderate disease in the remaining portions of the RCA. Preserved LV function   . Pure hypercholesterolemia   . Hypertension   . Morbid obesity   . Tobacco abuse   . Myocardial infarction     DR COOPER   . Asthma     AS CHILD  . Shortness of breath     WITH EXERTION   . Diabetes mellitus      Current Outpatient Prescriptions  Medication Sig Dispense Refill  . amiodarone (PACERONE) 400 MG tablet Take 1 tablet (400 mg total) by mouth 2 (two) times daily. For 6 days then take Amiodarone 400 mg po daily thereafter.  60 tablet  1  . aspirin EC 325 MG tablet Take 325 mg by mouth daily.      [name] atorvastatin (LIPITOR) 40 MG tablet Take 40 mg by mouth daily.      . chlorhexidine (PERIDEX) 0.12 % solution Use as directed 15 mLs in the mouth or throat 2 (two) times daily. Use after breakfast and  before bed daily      . colchicine 0.6 MG tablet Take 0.6 mg by mouth 2 (two) times daily as needed. For gout flare      . furosemide (LASIX) 40 MG tablet Take 80 mg by mouth 2 (two) times daily.       [name] guaiFENesin (MUCINEX) 600 MG 12 hr tablet Take 1 tablet (600 mg total) by mouth 2 (two) times daily. For cough      . metoprolol (LOPRESSOR) 25 MG tablet Take 1 tablet (25 mg total) by mouth 2 (two) times daily.      [name] oxycodone (OXY-IR) 5 MG capsule Take 5 mg by mouth every 4 (four) hours as needed.         Physical Exam: BP 139/66  Pulse 54  Resp 18  Ht 5\' 6"  (1.676 m)  Wt 300 lb (136.079 kg)  BMI 48.42 kg/m2  SpO2 98% Morbidly obese 66 year old man in no acute distress Sternum stable, incision clean dry and intact Chest tube incision open and granulating no sign of infection Lungs diminished breath sounds at both bases otherwise clear Extremities 3+ edema bilaterally  Diagnostic Tests: Chest x-ray has poor penetration due to his body habitus and, shows cardiomegaly small bilateral effusions  Impression: 66 year old morbidly obese man with multiple medical problems now 3 weeks out from coronary  bypass grafting x4. Overall I think he's doing pretty well at this point in time, but about as well as she could hope for given his other issues. He's going to be going home from rehabilitation tomorrow.  He's 98% saturated on 1 L nasal cannula and probably does not need to use the oxygen at this time, but he will have a tank at home if he were to need it.  The chest tube site should heal well does not need any specific care, but if he does develop exudate he complained of peroxide every 2-3 days as needed.  I encouraged him to keep his legs elevated at night and at least one hour during the day.  Given his increased peripheral edema I am going to increase his Lasix to 80 mg twice a day.  Plan: Lasix 80 mg by mouth twice a day Will see Dr. Excell Seltzer tomorrow I will plan see him back in 3  weeks with a repeat chest x-ray to check on his progress

## 2012-03-15 ENCOUNTER — Ambulatory Visit (INDEPENDENT_AMBULATORY_CARE_PROVIDER_SITE_OTHER): Payer: Medicare Other | Admitting: Cardiovascular Disease

## 2012-03-15 ENCOUNTER — Encounter: Payer: Self-pay | Admitting: Cardiovascular Disease

## 2012-03-15 VITALS — BP 128/66 | HR 56 | Ht 66.0 in | Wt 304.8 lb

## 2012-03-15 DIAGNOSIS — E785 Hyperlipidemia, unspecified: Secondary | ICD-10-CM

## 2012-03-15 DIAGNOSIS — I2510 Atherosclerotic heart disease of native coronary artery without angina pectoris: Secondary | ICD-10-CM

## 2012-03-15 NOTE — Patient Instructions (Signed)
Your physician recommends that you schedule a follow-up appointment in: 4 weeks with Dr. Excell Seltzer.  Your physician recommends that you return for fasting lab work in: 4 weeks.  CBC, CMET, TSH, LIPID Panel

## 2012-03-15 NOTE — Progress Notes (Signed)
HPI:  This is a 66 year old morbidly obese gentleman presenting for hospital followup evaluation. He presented with unstable angina in April and was found to have severe multivessel coronary artery disease. He ultimately was treated with 4 vessel CABG using a LIMA to LAD, vein graft to PDA, vein graft to obtuse marginal, and vein graft to diagonal. He had postoperative atrial fibrillation and was discharged on amiodarone.  Overall he is doing well. He is still in a nursing facility but plans on returning home any day now. He denies chest pain or pressure. He feels much better than he did prior to bypass. He still having some trouble getting around and his overall progress is fairly slow. He denies orthopnea, PND, palpitations, or syncope. He has chronic leg edema.  Outpatient Encounter Prescriptions as of 03/15/2012  Medication Sig Dispense Refill  . amiodarone (PACERONE) 400 MG tablet Take 1 tablet (400 mg total) by mouth 2 (two) times daily. For 6 days then take Amiodarone 400 mg po daily thereafter.  60 tablet  1  . aspirin EC 325 MG tablet Take 325 mg by mouth daily.      [name] atorvastatin (LIPITOR) 40 MG tablet Take 40 mg by mouth daily.      . chlorhexidine (PERIDEX) 0.12 % solution Use as directed 15 mLs in the mouth or throat 2 (two) times daily. Use after breakfast and before bed daily      . colchicine 0.6 MG tablet Take 0.6 mg by mouth 2 (two) times daily as needed. For gout flare      . furosemide (LASIX) 40 MG tablet Take 80 mg by mouth 2 (two) times daily.       [name] guaiFENesin (MUCINEX) 600 MG 12 hr tablet Take 1 tablet (600 mg total) by mouth 2 (two) times daily. For cough      . metoprolol (LOPRESSOR) 25 MG tablet Take 1 tablet (25 mg total) by mouth 2 (two) times daily.      [name] oxycodone (OXY-IR) 5 MG capsule Take 5 mg by mouth every 4 (four) hours as needed.        Allergies  Allergen Reactions  . Ciprofloxacin Hcl Other (See Comments)    Fluoroquinolones:difficulty swallowing and  Sore mouth    Past Medical History  Diagnosis Date  . Other malaise and fatigue   . Swelling of limb   . Edema   . Osteoarthrosis, unspecified whether generalized or localized, unspecified site   . Congestive heart failure, unspecified     EF 55-65% by cath 02/06/12  . CAD (coronary artery disease), native coronary artery 2006    Stent placed in 2006 in [place], [place]; Cath 02/06/12 Severe three-vessel coronary artery disease with critical stenosis in the mid LAD, severe stenosis of the left circumflex, and total occlusion of a small branch of the right coronary artery with moderate disease in the remaining portions of the RCA. Preserved LV function   . Pure hypercholesterolemia   . Hypertension   . Morbid obesity   . Tobacco abuse   . Myocardial infarction     DR Mashell Sieben   . Asthma     AS CHILD  . Shortness of breath     WITH EXERTION   . Diabetes mellitus     ROS: Negative except as per HPI  Ht 5\' 6"  (1.676 m)  Wt 138.256 kg (304 lb 12.8 oz)  BMI 49.20 kg/m2  PHYSICAL EXAM: Pt is alert and oriented, morbidly obese gentleman in NAD HEENT:  normal Neck: JVP - normal, carotids 2+= without bruits Lungs: Diminished in the bases bilaterally CV: RRR without murmur or gallop Abd: soft, NT, Positive BS, no hepatomegaly Ext: 2-3+ bilateral pretibial edema, distal pulses intact and equal Skin: Incision sites healing well  EKG:  Sinus bradycardia 56 beats per minute, low-voltage QRS, incomplete right bundle branch block, age indeterminate anteroseptal infarct.  ASSESSMENT AND PLAN: 1. CAD status post CABG after presentation with unstable angina. The patient is making good early progress, especially considering his morbid obesity. He should continue on his current program.  2. Postoperative atrial fibrillation. I would recommend continuing amiodarone for now, I will likely stop this when he returns for followup in 4 weeks. He'll have followup liver function tests and thyroid studies when  he returns.  3. Hypertension. Controlled on current medical program  4. Diastolic heart failure. I agree that he needs more Lasix and his current dose of 80 mg twice daily is appropriate. He will have followup electrolytes when he returns. We had another long discussion about his need for major lifestyle changes and significant weight loss. He understands the impact of his morbid obesity on issues related to his heart disease, long-term graft patency, and control of confounding factors such as hypertension and dyslipidemia.  5. Followup. I would like to see him back in 4 weeks or sooner if problems arise.  Tonny Bollman 03/22/2012 2:00 PM

## 2012-03-21 ENCOUNTER — Encounter: Payer: Medicare Other | Admitting: Thoracic Surgery (Cardiothoracic Vascular Surgery)

## 2012-03-22 ENCOUNTER — Encounter: Payer: Self-pay | Admitting: Cardiovascular Disease

## 2012-03-29 ENCOUNTER — Other Ambulatory Visit: Payer: Self-pay | Admitting: Thoracic Surgery (Cardiothoracic Vascular Surgery)

## 2012-03-29 DIAGNOSIS — I2510 Atherosclerotic heart disease of native coronary artery without angina pectoris: Secondary | ICD-10-CM

## 2012-04-03 ENCOUNTER — Ambulatory Visit (HOSPITAL_COMMUNITY)
Admission: RE | Admit: 2012-04-03 | Discharge: 2012-04-03 | Disposition: A | Discharge status: Home / Self Care | Payer: Medicare Other | Source: Ambulatory Visit | Attending: Thoracic Surgery (Cardiothoracic Vascular Surgery) | Admitting: Thoracic Surgery (Cardiothoracic Vascular Surgery)

## 2012-04-03 ENCOUNTER — Encounter: Payer: Self-pay | Admitting: Thoracic Surgery (Cardiothoracic Vascular Surgery)

## 2012-04-03 ENCOUNTER — Ambulatory Visit (INDEPENDENT_AMBULATORY_CARE_PROVIDER_SITE_OTHER): Payer: Self-pay | Admitting: Thoracic Surgery (Cardiothoracic Vascular Surgery)

## 2012-04-03 VITALS — BP 138/74 | HR 65 | Resp 18 | Ht 66.0 in | Wt 300.0 lb

## 2012-04-03 DIAGNOSIS — J45909 Unspecified asthma, uncomplicated: Secondary | ICD-10-CM | POA: Insufficient documentation

## 2012-04-03 DIAGNOSIS — E119 Type 2 diabetes mellitus without complications: Secondary | ICD-10-CM | POA: Insufficient documentation

## 2012-04-03 DIAGNOSIS — Z09 Encounter for follow-up examination after completed treatment for conditions other than malignant neoplasm: Secondary | ICD-10-CM | POA: Insufficient documentation

## 2012-04-03 DIAGNOSIS — I2510 Atherosclerotic heart disease of native coronary artery without angina pectoris: Secondary | ICD-10-CM

## 2012-04-03 DIAGNOSIS — R0602 Shortness of breath: Secondary | ICD-10-CM | POA: Insufficient documentation

## 2012-04-03 DIAGNOSIS — Z951 Presence of aortocoronary bypass graft: Secondary | ICD-10-CM | POA: Insufficient documentation

## 2012-04-03 DIAGNOSIS — I10 Essential (primary) hypertension: Secondary | ICD-10-CM | POA: Insufficient documentation

## 2012-04-03 DIAGNOSIS — I517 Cardiomegaly: Secondary | ICD-10-CM | POA: Insufficient documentation

## 2012-04-03 NOTE — Progress Notes (Signed)
HPI:  [name] returns for a scheduled visit. He a coronary bypass grafting x4 in April 10. He had some atrial fibrillation postop.  I saw him in the office about 3 weeks ago, at that time he was volume overloaded with peripheral edema and bilateral pleural effusions. He now returns stating that he's feeling much better and is anxious to return to work. He denies any problems with his breathing.  Past Medical History  Diagnosis Date  . Other malaise and fatigue   . Swelling of limb   . Edema   . Osteoarthrosis, unspecified whether generalized or localized, unspecified site   . Congestive heart failure, unspecified     EF 55-65% by cath 02/06/12  . CAD (coronary artery disease), native coronary artery 2006    Stent placed in 2006 in [place], [place]; Cath 02/06/12 Severe three-vessel coronary artery disease with critical stenosis in the mid LAD, severe stenosis of the left circumflex, and total occlusion of a small branch of the right coronary artery with moderate disease in the remaining portions of the RCA. Preserved LV function   . Pure hypercholesterolemia   . Hypertension   . Morbid obesity   . Tobacco abuse   . Myocardial infarction     DR COOPER   . Asthma     AS CHILD  . Shortness of breath     WITH EXERTION   . Diabetes mellitus       Current Outpatient Prescriptions  Medication Sig Dispense Refill  . amiodarone (PACERONE) 400 MG tablet Take 1 tablet (400 mg total) by mouth 2 (two) times daily. For 6 days then take Amiodarone 400 mg po daily thereafter.  60 tablet  1  . aspirin EC 325 MG tablet Take 325 mg by mouth daily.      [name] atorvastatin (LIPITOR) 40 MG tablet Take 40 mg by mouth daily.      . calcium-vitamin D (OSCAL WITH D) 500-200 MG-UNIT per tablet Take 1 tablet by mouth 2 (two) times daily.      . chlorhexidine (PERIDEX) 0.12 % solution Use as directed 15 mLs in the mouth or throat 2 (two) times daily. Use after breakfast and before bed daily       . Cholecalciferol  (VITAMIN D3) 1000 UNITS CAPS Take by mouth daily.      . colchicine 0.6 MG tablet Take 0.6 mg by mouth 2 (two) times daily as needed. For gout flare      . docusate sodium (COLACE) 100 MG capsule Take 100 mg by mouth 2 (two) times daily.      . furosemide (LASIX) 40 MG tablet Take 40 mg by mouth daily.       [name] guaiFENesin (MUCINEX) 600 MG 12 hr tablet Take 1 tablet (600 mg total) by mouth 2 (two) times daily. For cough      . metoprolol (LOPRESSOR) 25 MG tablet Take 1 tablet (25 mg total) by mouth 2 (two) times daily.      . Multiple Vitamin (MULTIVITAMIN) tablet Take 1 tablet by mouth daily.      [name] oxycodone (OXY-IR) 5 MG capsule Take 5 mg by mouth every 4 (four) hours as needed.      . polyethylene glycol (MIRALAX / GLYCOLAX) packet Take 17 g by mouth daily.      . vitamin C (ASCORBIC ACID) 500 MG tablet Take 500 mg by mouth daily.      [name] zinc gluconate 50 MG tablet Take 50 mg by mouth  daily.        Physical Exam BP 138/74  Pulse 65  Resp 18  Ht 5\' 6"  (1.676 m)  Wt 300 lb (136.079 kg)  BMI 48.42 kg/m2  SpO2 95% Morbidly obese 66 year old male in no acute distress Sternal incision and chest tube sites well-healed  Diagnostic Tests: Chest x-ray shows marked improvement in his bilateral pleural effusions, there are still small effusions bilaterally  Impression: Mr. Wailes continues to improve. He has responded to the increased dose of Lasix. His pleural effusions have resolved. He is anxious to return to work. I have asked him to wait 2 more weeks to be approximately 8 weeks out from surgery,.  Plan:  I will be happy to see Mr. Gloria back any time the future I can be of any further assistance with his care

## 2012-04-17 ENCOUNTER — Other Ambulatory Visit: Payer: Medicare Other

## 2012-04-17 ENCOUNTER — Ambulatory Visit: Payer: Medicare Other | Admitting: Cardiovascular Disease

## 2012-04-25 ENCOUNTER — Ambulatory Visit: Payer: Medicare Other | Admitting: Cardiovascular Disease

## 2012-04-25 ENCOUNTER — Other Ambulatory Visit: Payer: Medicare Other

## 2012-06-15 ENCOUNTER — Ambulatory Visit: Payer: Medicare Other | Admitting: Cardiovascular Disease

## 2012-07-20 ENCOUNTER — Encounter: Payer: Self-pay | Admitting: Cardiovascular Disease

## 2012-07-20 ENCOUNTER — Ambulatory Visit (INDEPENDENT_AMBULATORY_CARE_PROVIDER_SITE_OTHER): Payer: Medicare Other | Admitting: Cardiovascular Disease

## 2012-07-20 VITALS — BP 152/70 | HR 61 | Ht 66.0 in | Wt 301.8 lb

## 2012-07-20 DIAGNOSIS — I2510 Atherosclerotic heart disease of native coronary artery without angina pectoris: Secondary | ICD-10-CM

## 2012-07-20 DIAGNOSIS — E785 Hyperlipidemia, unspecified: Secondary | ICD-10-CM

## 2012-07-20 LAB — COMPREHENSIVE METABOLIC PANEL
ALT: 21 U/L (ref 0–53)
AST: 23 U/L (ref 0–37)
Albumin: 3.7 g/dL (ref 3.5–5.2)
BUN: 16 mg/dL (ref 6–23)
Calcium: 8.7 mg/dL (ref 8.4–10.5)
Chloride: 106 mEq/L (ref 96–112)
Potassium: 4.2 mEq/L (ref 3.5–5.1)
Sodium: 141 mEq/L (ref 135–145)
Total Protein: 7.4 g/dL (ref 6.0–8.3)

## 2012-07-20 LAB — CBC WITH DIFFERENTIAL/PLATELET
Basophils Relative: 0.8 % (ref 0.0–3.0)
Eosinophils Absolute: 0.2 10*3/uL (ref 0.0–0.7)
Lymphocytes Relative: 32.8 % (ref 12.0–46.0)
MCHC: 31.7 g/dL (ref 30.0–36.0)
Neutrophils Relative %: 55.3 % (ref 43.0–77.0)
Platelets: 151 10*3/uL (ref 150.0–400.0)
RBC: 4.46 Mil/uL (ref 4.22–5.81)
WBC: 6.6 10*3/uL (ref 4.5–10.5)

## 2012-07-20 LAB — LIPID PANEL
Cholesterol: 113 mg/dL (ref 0–200)
LDL Cholesterol: 53 mg/dL (ref 0–99)
VLDL: 19.6 mg/dL (ref 0.0–40.0)

## 2012-07-20 MED ORDER — LISINOPRIL 10 MG PO TABS: 10.0000 mg | ORAL_TABLET | Freq: Every day | ORAL | Status: DC

## 2012-07-20 NOTE — Patient Instructions (Addendum)
Your physician has recommended you make the following change in your medication:  Start Lisinopril 10 mg daily  Your physician recommends that you have lab work drawn today: cbc,bmp,lipid,liver, tsh.  We will call you with your results.  Your physician wants you to follow-up in: 6 months with Dr. Excell Seltzer. You will receive a reminder letter in the mail two months in advance. If you don't receive a letter, please call our office to schedule the follow-up appointment.

## 2012-07-20 NOTE — Progress Notes (Signed)
HPI:  66 year old gentleman presenting for followup evaluation. The patient has a history of multivessel coronary artery disease. He presented in April 2013 with unstable anginal symptoms. He underwent four-vessel CABG with the LIMA to LAD, SVG to PDA, SVG to OM, and SVG to diagonal. He feels remarkably better. He is able to get out and play with his son in the yard. Before surgery he had chest pain with even minimal exertion. He has not engaged in routine exercise. He remains morbidly obese and relatively sedentary. He denies chest pain or pressure. The patient has chronic dyspnea with little change. He denies orthopnea, PND, palpitations, or syncope. He has been compliant with his medications.  Outpatient Encounter Prescriptions as of 07/20/2012  Medication Sig Dispense Refill  . amiodarone (PACERONE) 400 MG tablet Take 1 tablet (400 mg total) by mouth 2 (two) times daily. For 6 days then take Amiodarone 400 mg po daily thereafter.  60 tablet  1  . aspirin EC 325 MG tablet Take 325 mg by mouth daily.      [name] atorvastatin (LIPITOR) 40 MG tablet Take 40 mg by mouth daily.      . calcium-vitamin D (OSCAL WITH D) 500-200 MG-UNIT per tablet Take 1 tablet by mouth 2 (two) times daily.      . chlorhexidine (PERIDEX) 0.12 % solution Use as directed 15 mLs in the mouth or throat 2 (two) times daily. Use after breakfast and before bed daily       . Cholecalciferol (VITAMIN D3) 1000 UNITS CAPS Take by mouth daily.      . colchicine 0.6 MG tablet Take 0.6 mg by mouth 2 (two) times daily as needed. For gout flare      . docusate sodium (COLACE) 100 MG capsule Take 100 mg by mouth 2 (two) times daily.      . furosemide (LASIX) 40 MG tablet Take 40 mg by mouth daily.       [name] guaiFENesin (MUCINEX) 600 MG 12 hr tablet Take 1 tablet (600 mg total) by mouth 2 (two) times daily. For cough      . metoprolol (LOPRESSOR) 25 MG tablet Take 1 tablet (25 mg total) by mouth 2 (two) times daily.      . Multiple Vitamin  (MULTIVITAMIN) tablet Take 1 tablet by mouth daily.      [name] oxycodone (OXY-IR) 5 MG capsule Take 5 mg by mouth every 4 (four) hours as needed.      . polyethylene glycol (MIRALAX / GLYCOLAX) packet Take 17 g by mouth daily.      . vitamin C (ASCORBIC ACID) 500 MG tablet Take 500 mg by mouth daily.      [name] zinc gluconate 50 MG tablet Take 50 mg by mouth daily.        Allergies  Allergen Reactions  . Ciprofloxacin Hcl Other (See Comments)    Fluoroquinolones:difficulty swallowing and Sore mouth    Past Medical History  Diagnosis Date  . Other malaise and fatigue   . Swelling of limb   . Edema   . Osteoarthrosis, unspecified whether generalized or localized, unspecified site   . Congestive heart failure, unspecified     EF 55-65% by cath 02/06/12  . CAD (coronary artery disease), native coronary artery 2006    Stent placed in 2006 in [place], [place]; Cath 02/06/12 Severe three-vessel coronary artery disease with critical stenosis in the mid LAD, severe stenosis of the left circumflex, and total occlusion of a small branch of the  right coronary artery with moderate disease in the remaining portions of the RCA. Preserved LV function   . Pure hypercholesterolemia   . Hypertension   . Morbid obesity   . Tobacco abuse   . Myocardial infarction     DR Kaladin Noseworthy   . Asthma     AS CHILD  . Shortness of breath     WITH EXERTION   . Diabetes mellitus     ROS: Negative except as per HPI  BP 152/70  Pulse 61  Ht 5\' 6"  (1.676 m)  Wt 136.896 kg (301 lb 12.8 oz)  BMI 48.71 kg/m2  SpO2 94%  PHYSICAL EXAM: Pt is alert and oriented, very nice morbidly obese male in NAD HEENT: normal Neck: JVP - normal, carotids 2+= without bruits Lungs: CTA bilaterally CV: RRR without murmur or gallop Abd: soft, NT, Positive BS, obese Ext: Diffuse 1+ edema bilaterally, distal pulses intact and equal Skin: warm/dry no rash  ASSESSMENT AND PLAN: 1. Coronary artery disease status post CABG. The patient is on  appropriate medical therapy with aspirin and a statin drug. Will add an ACE inhibitor in the setting of his diabetes. Blood work was drawn today and his creatinine is 1.5 mg/dL. He will start lisinopril 10 mg daily.  2. Postoperative atrial fibrillation. He has been in sinus rhythm on oral amiodarone. This should be stopped as he is several months out from surgery. We will confirm that he is off of this medication.  3. Hypertension with suboptimal control. Add lisinopril 10 mg. The patient should have a metabolic panel rechecked in a few weeks.  4. Hyperlipidemia. Lipids were reviewed and they're at goal. His liver function tests are normal. He's on atorvastatin 40 mg daily.  Tonny Bollman 07/30/2012

## 2012-08-08 ENCOUNTER — Telehealth: Payer: Self-pay

## 2012-08-08 NOTE — Telephone Encounter (Signed)
Message copied by Iona Coach on Wed Aug 08, 2012  4:23 PM ------      Message from: Tonny Bollman      Created: Mon Jul 30, 2012  5:10 PM       2 things with him - can we confirm that he is off Amio? Also - he should have BMET in a few wks after starting lisinopril. thx

## 2012-08-08 NOTE — Telephone Encounter (Signed)
I spoke with the pt and he is still taking Amiodarone.  I made the pt aware that he needs to stop this medication at this time. Pt verbalized understanding.  I also made the pt aware that he needs to have a BMP repeated next week.  The pt would like to get this drawn closer to home. I will mail an order to the pt's home for lab work.  Pt agreed with plan.

## 2012-08-20 ENCOUNTER — Encounter: Payer: Self-pay | Admitting: Cardiovascular Disease

## 2013-01-25 ENCOUNTER — Encounter: Payer: Self-pay | Admitting: Cardiovascular Disease

## 2013-01-25 ENCOUNTER — Ambulatory Visit (INDEPENDENT_AMBULATORY_CARE_PROVIDER_SITE_OTHER): Payer: Medicare Other | Admitting: Cardiovascular Disease

## 2013-01-25 VITALS — BP 149/74 | HR 78 | Ht 66.0 in | Wt 328.4 lb

## 2013-01-25 DIAGNOSIS — I2510 Atherosclerotic heart disease of native coronary artery without angina pectoris: Secondary | ICD-10-CM

## 2013-01-25 NOTE — Progress Notes (Signed)
HPI:  67 year old gentleman presenting for followup evaluation. The patient has multivessel coronary artery disease. He underwent 4 vessel CABG in 2013. He is now just about one year out from his surgery. He is morbidly obese.  He has gained a lot of weight since I last saw him. His weight is up to nearly 330 pounds. He is not able to do much exercise at all because of joint problems and shortness of breath. He's had no chest pain or pressure. For bypass surgery, he was severely limited by chest discomfort with almost any activity. He's had no pain whatsoever since his surgery. He denies orthopnea or PND.  Outpatient Encounter Prescriptions as of 01/25/2013  Medication Sig Dispense Refill  . aspirin EC 325 MG tablet Take 325 mg by mouth daily.      [name] atorvastatin (LIPITOR) 40 MG tablet Take 40 mg by mouth daily.      . chlorhexidine (PERIDEX) 0.12 % solution Use as directed 15 mLs in the mouth or throat 2 (two) times daily. Use after breakfast and before bed daily       . colchicine 0.6 MG tablet Take 0.6 mg by mouth 2 (two) times daily as needed. For gout flare      . furosemide (LASIX) 40 MG tablet Take 40 mg by mouth daily.       [name] guaiFENesin (MUCINEX) 600 MG 12 hr tablet Take 1 tablet (600 mg total) by mouth 2 (two) times daily. For cough      . lisinopril (PRINIVIL,ZESTRIL) 10 MG tablet Take 1 tablet (10 mg total) by mouth daily.  30 tablet  11  . metoprolol (LOPRESSOR) 25 MG tablet Take 1 tablet (25 mg total) by mouth 2 (two) times daily.      [name] oxycodone (OXY-IR) 5 MG capsule Take 5 mg by mouth every 4 (four) hours as needed.      . polyethylene glycol (MIRALAX / GLYCOLAX) packet Take 17 g by mouth daily.      [name] zinc gluconate 50 MG tablet Take 50 mg by mouth daily.      . [DISCONTINUED] calcium-vitamin D (OSCAL WITH D) 500-200 MG-UNIT per tablet Take 1 tablet by mouth 2 (two) times daily.      . [DISCONTINUED] Cholecalciferol (VITAMIN D3) 1000 UNITS CAPS Take by mouth daily.      .  [DISCONTINUED] docusate sodium (COLACE) 100 MG capsule Take 100 mg by mouth 2 (two) times daily.      . [DISCONTINUED] Multiple Vitamin (MULTIVITAMIN) tablet Take 1 tablet by mouth daily.      . [DISCONTINUED] vitamin C (ASCORBIC ACID) 500 MG tablet Take 500 mg by mouth daily.       No facility-administered encounter medications on file as of 01/25/2013.    Allergies  Allergen Reactions  . Ciprofloxacin Hcl Other (See Comments)    Fluoroquinolones:difficulty swallowing and Sore mouth    Past Medical History  Diagnosis Date  . Other malaise and fatigue   . Swelling of limb   . Edema   . Osteoarthrosis, unspecified whether generalized or localized, unspecified site   . Congestive heart failure, unspecified     EF 55-65% by cath 02/06/12  . CAD (coronary artery disease), native coronary artery 2006    Stent placed in 2006 in [place], [place]; Cath 02/06/12 Severe three-vessel coronary artery disease with critical stenosis in the mid LAD, severe stenosis of the left circumflex, and total occlusion of a small branch of the right coronary artery with  moderate disease in the remaining portions of the RCA. Preserved LV function   . Pure hypercholesterolemia   . Hypertension   . Morbid obesity   . Tobacco abuse   . Myocardial infarction     DR COOPER   . Asthma     AS CHILD  . Shortness of breath     WITH EXERTION   . Diabetes mellitus    ROS: Negative except as per HPI  BP 149/74  Pulse 78  Ht 5\' 6"  (1.676 m)  Wt 148.961 kg (328 lb 6.4 oz)  BMI 53.03 kg/m2  SpO2 97%  PHYSICAL EXAM: Pt is alert and oriented, very pleasant, morbidly obese male in NAD HEENT: normal Neck: JVP - normal, carotids 2+= without bruits Lungs: CTA bilaterally CV: RRR without murmur or gallop Abd: soft, NT, obese Ext: Trace pretibial edema bilaterally Skin: warm/dry no rash  EKG:  Sinus rhythm with PVCs, heart rate 61 beats per minute, otherwise within normal limits.  ASSESSMENT AND PLAN: 1. Coronary  artery disease, native vessel. The patient remains free of angina one year out from bypass surgery. He will continue on his current medical program which includes aspirin, a statin drug, and ACE inhibitor, and a beta blocker. The primary issue related to long-term health as his morbid obesity. We had a lengthy discussion today about this and he understands. He is going to try to increase his activity and reduce starch in his diet. We reviewed specific foods to avoid.  2. Hypertension. Blood pressure control is borderline. He will continue on his current medical program and work on lifestyle changes.  3. Hyperlipidemia. The patient is on a high intensity statin drug. He's followed by Dr. Sherryll Burger.  Her followup will see him back in 12 months.  Tonny Bollman 01/25/2013 11:22 AM

## 2013-01-25 NOTE — Patient Instructions (Addendum)
Your physician wants you to follow-up in: 1 YEAR with Dr Cooper.  You will receive a reminder letter in the mail two months in advance. If you don't receive a letter, please call our office to schedule the follow-up appointment.  Your physician recommends that you continue on your current medications as directed. Please refer to the Current Medication list given to you today.  

## 2013-06-13 IMAGING — CR DG CHEST 1V PORT
1 series · 1 of 1 positions shown · non-contrast
Comparison: Chest radiograph 02/24/2012

CLINICAL DATA: Fluid overload, short of breath

PORTABLE CHEST - 1 VIEW

[view not recorded]
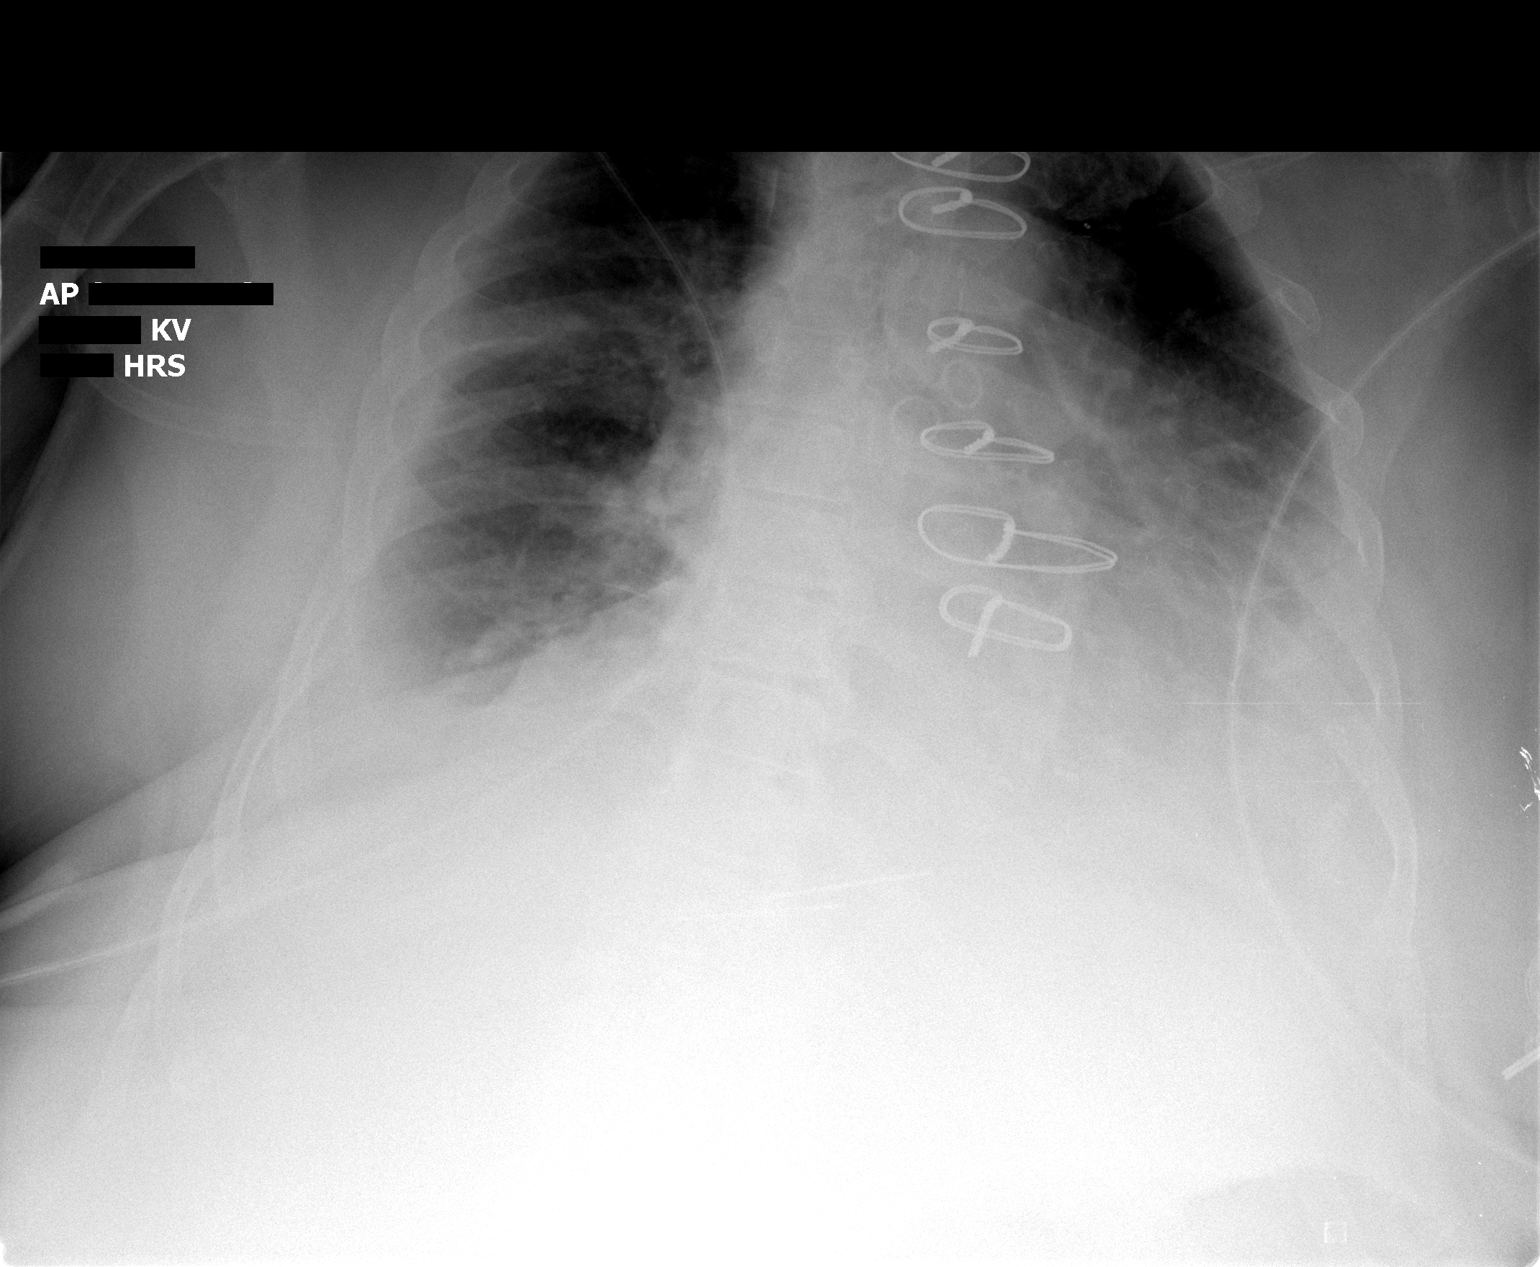

[1 of 1 positions shown; findings below may reference images not displayed]

FINDINGS: Right IJ sheath unchanged in position.  Stable enlarged
heart silhouette.  There are bilateral pleural effusions similar to
prior.  Left basilar atelectasis similar.  Central venous
congestion is present.  No overt pulmonary edema. No pneumothorax.
IMPRESSION: 1..  No significant change.
2.  Cardiomegaly , central venous congestion, and bilateral pleural
effusions.

## 2013-07-18 ENCOUNTER — Other Ambulatory Visit: Payer: Self-pay | Admitting: Cardiovascular Disease

## 2014-02-24 ENCOUNTER — Ambulatory Visit: Payer: Medicare Other | Admitting: Cardiovascular Disease

## 2014-07-31 ENCOUNTER — Other Ambulatory Visit: Payer: Self-pay | Admitting: Cardiovascular Disease

## 2014-08-31 ENCOUNTER — Other Ambulatory Visit: Payer: Self-pay | Admitting: Cardiovascular Disease

## 2014-09-25 ENCOUNTER — Other Ambulatory Visit: Payer: Self-pay | Admitting: Cardiovascular Disease

## 2014-10-23 ENCOUNTER — Encounter (HOSPITAL_COMMUNITY): Payer: Self-pay | Admitting: Cardiovascular Disease

## 2014-10-24 ENCOUNTER — Other Ambulatory Visit (HOSPITAL_COMMUNITY): Payer: Self-pay | Admitting: Cardiovascular Disease

## 2014-11-27 ENCOUNTER — Ambulatory Visit (INDEPENDENT_AMBULATORY_CARE_PROVIDER_SITE_OTHER): Payer: Medicare Other | Admitting: Cardiology

## 2014-11-27 ENCOUNTER — Encounter: Payer: Self-pay | Admitting: Cardiology

## 2014-11-27 VITALS — BP 137/85 | HR 101 | Ht 66.0 in | Wt 299.0 lb

## 2014-11-27 DIAGNOSIS — E785 Hyperlipidemia, unspecified: Secondary | ICD-10-CM

## 2014-11-27 DIAGNOSIS — I2510 Atherosclerotic heart disease of native coronary artery without angina pectoris: Secondary | ICD-10-CM

## 2014-11-27 DIAGNOSIS — I10 Essential (primary) hypertension: Secondary | ICD-10-CM

## 2014-11-27 MED ORDER — FUROSEMIDE 40 MG PO TABS: 40.0000 mg | ORAL_TABLET | Freq: Every day | ORAL | Status: DC

## 2014-11-27 MED ORDER — RANITIDINE HCL 150 MG PO TABS: 150.0000 mg | ORAL_TABLET | Freq: Two times a day (BID) | ORAL | Status: DC

## 2014-11-27 MED ORDER — LISINOPRIL 10 MG PO TABS: 10.0000 mg | ORAL_TABLET | Freq: Every day | ORAL | Status: DC

## 2014-11-27 MED ORDER — ATORVASTATIN CALCIUM 80 MG PO TABS: 80.0000 mg | ORAL_TABLET | Freq: Every day | ORAL | Status: DC

## 2014-11-27 MED ORDER — METOPROLOL TARTRATE 25 MG PO TABS
25.0000 mg | ORAL_TABLET | Freq: Two times a day (BID) | ORAL | Status: DC
Start: 2014-11-27 — End: 2015-06-16

## 2014-11-27 NOTE — Progress Notes (Addendum)
Clinical Summary Mr. Natividad BroodBrimmer is a 69 y.o.male former patient of Dr Excell Seltzerooper, this is our first visit together. She is [facility] for the following medical problems.  1. CAD - prior stents in the past, CABG in 02/2012 4 vessel (LIMA-LAD,SVG-PDA,SVG-OM,SVG-diag). LVEF 55-65% by LV gram in 2013, do not see echo in system.  - describes some chest pain with laying down with belching like feeling. No pressure like chest pain - denies any SOB, no DOE - ran out some of his meds 1 month ago  2. Hyperlipidemia - compliant with statin, reports recent lipid panel by pcp   3. Hypertension - does not check regularly at home - ran out of lisinopril 1 month ago   4. Tobacco abuse - quit smoking around time of CABG  5. OSA screen - no daytime somnolence, + snoring, no apneic episodes  Past Medical History  Diagnosis Date  . Other malaise and fatigue   . Swelling of limb   . Edema   . Osteoarthrosis, unspecified whether generalized or localized, unspecified site   . Congestive heart failure, unspecified     EF 55-65% by cath 02/06/12  . CAD (coronary artery disease), native coronary artery 2006    [place]ent [place] in 2006 in [place]; Cath 02/06/12 Severe three-vessel coronary artery disease with critical stenosis in the mid LAD, severe stenosis of the left circumflex, and total occlusion of a small branch of the right coronary artery with moderate disease in the remaining portions of the RCA. Preserved LV function   . Pure hypercholesterolemia   . Hypertension   . Morbid obesity   . Tobacco abuse   . Myocardial infarction     DR COOPER   . Asthma     AS CHILD  . Shortness of breath     WITH EXERTION   . Diabetes mellitus      Allergies  Allergen Reactions  . Ciprofloxacin Hcl Other (See Comments)    Fluoroquinolones:difficulty swallowing and Sore mouth     Current Outpatient Prescriptions  Medication Sig Dispense Refill  . aspirin EC 325 MG  tablet Take 325 mg by mouth daily.    [name]. atorvastatin (LIPITOR) 40 MG tablet Take 40 mg by mouth daily.    . chlorhexidine (PERIDEX) 0.12 % solution Use as directed 15 mLs in the mouth or throat 2 (two) times daily. Use after breakfast and before bed daily     . colchicine 0.6 MG tablet Take 0.6 mg by mouth 2 (two) times daily as needed. For gout flare    . furosemide (LASIX) 40 MG tablet Take 40 mg by mouth daily.     [name]. lisinopril (PRINIVIL,ZESTRIL) 10 MG tablet TAKE 1 TABLET BY MOUTH ONCE DAILY *NEED APPOINTMENT FOR MORE REFILLS* 7 tablet 0  . metoprolol (LOPRESSOR) 25 MG tablet Take 1 tablet (25 mg total) by mouth 2 (two) times daily.    [name]. oxycodone (OXY-IR) 5 MG capsule Take 5 mg by mouth every 4 (four) hours as needed.    . polyethylene glycol (MIRALAX / GLYCOLAX) packet Take 17 g by mouth daily.    [name]. zinc gluconate 50 MG tablet Take 50 mg by mouth daily.     No current facility-administered medications for this visit.     Past Surgical History  Procedure Laterality Date  . Cardiac catheterization    . Coronary angioplasty with stent placement    . Coronary artery bypass graft  02/22/2012  Procedure: CORONARY ARTERY BYPASS GRAFTING (CABG);  Surgeon: Loreli Slot, MD;  Location: [facility] OR;  Service: Open Heart Surgery;  Laterality: N/A;  CABG x four;  using left internal mammary artery and right leg greater saphenous vein harvested endoscopically  . Left heart catheterization with coronary angiogram N/A 02/06/2012    Procedure: LEFT HEART CATHETERIZATION WITH CORONARY ANGIOGRAM;  Surgeon: Tonny Bollman, MD;  Location: [facility] CATH LAB;  Service: Cardiovascular;  Laterality: N/A;     Allergies  Allergen Reactions  . Ciprofloxacin Hcl Other (See Comments)    Fluoroquinolones:difficulty swallowing and Sore mouth      No family history on file.   Social History Mr. Misiaszek reports that he has quit smoking. His smoking use included Cigarettes. He has a 15 pack-year smoking  history. He has never used smokeless tobacco. Mr. Dykstra reports that he does not drink alcohol.   Review of Systems CONSTITUTIONAL: No weight loss, fever, chills, weakness or fatigue.  HEENT: Eyes: No visual loss, blurred vision, double vision or yellow sclerae.No hearing loss, sneezing, congestion, runny nose or sore throat.  SKIN: No rash or itching.  CARDIOVASCULAR: per HPI RESPIRATORY: No shortness of breath, cough or sputum.  GASTROINTESTINAL: No anorexia, nausea, vomiting or diarrhea. No abdominal pain or blood.  GENITOURINARY: No burning on urination, no polyuria NEUROLOGICAL: No headache, dizziness, syncope, paralysis, ataxia, numbness or tingling in the extremities. No change in bowel or bladder control.  MUSCULOSKELETAL: No muscle, back pain, joint pain or stiffness.  LYMPHATICS: No enlarged nodes. No history of splenectomy.  PSYCHIATRIC: No history of depression or anxiety.  ENDOCRINOLOGIC: No reports of sweating, cold or heat intolerance. No polyuria or polydipsia.  [name]   Physical Examination p 101 bp 137/85 Wt 299 lbs BMI 48 Gen: resting comfortably, no acute distress HEENT: no scleral icterus, pupils equal round and reactive, no palptable cervical adenopathy,  CV: RRR, no m/r/g, no JVD, no carotid bruits Resp: Clear to auscultation bilaterally GI: abdomen is soft, non-tender, non-distended, normal bowel sounds, no hepatosplenomegaly MSK: extremities are warm, no edema.  Skin: warm, no rash Neuro:  no focal deficits Psych: appropriate affect   Diagnostic Studies 01/2012 Cath Procedural Findings: Hemodynamics: AO 114/61 LV 114/21  Coronary angiography: Coronary dominance: right  Left mainstem: The left main is patent with mild tapering at the distal vessel but no more than 20% stenosis is noted. The left main divides into the LAD and left circumflex.  Left anterior descending (LAD): The LAD has critical stenosis in the mid vessel. The proximal vessel has mild  luminal irregularity until the first septal perforator. Just beyond the first perforator there is 95-99% stenosis present. There is a dilated segment just at the origin of the second diagonal branch. The LAD is segmentally diseased in the mid vessel with 90% stenosis beyond the diagonal origin. The diagonal branch has no obstructive disease present. The distal LAD has diffuse nonobstructive disease.  Left circumflex (LCx): The left circumflex is patent in the proximal aspect. The mid vessel has sequential high-grade lesions with the first lesion having 75-80% stenosis and the second lesion with at least 80% stenosis leading into a large second obtuse marginal branch. The second OM also has a high-grade lesion in the distal portion of that vessel that is only appreciated in the cranial views.  Right coronary artery (RCA): The right coronary artery is a large, dominant vessel. The vessel is diffusely diseased with no high-grade obstruction through the proximal and midportion. At the junction of the  mid and distal RCA there is a 60% smooth stenosis present. The vessel gives off a large PDA branch with diffuse disease in the 50-70% range throughout the proximal PDA. The posterolateral branch is occluded and the stent is not visible. The posterolateral branch fills from left to right collaterals.  Left ventriculography: Left ventricular systolic function is normal, LVEF is estimated at 55-65%, there is no significant mitral regurgitation   Final Conclusions:  1. Severe three-vessel coronary artery disease with critical stenosis in the mid LAD, severe stenosis of the left circumflex, and total occlusion of a small branch of the right coronary artery with moderate disease in the remaining portions of the RCA. 2. Preserved LV function  Recommendations: The patient has a large amount of myocardium in jeopardy. He is coronary disease is anatomically amenable to PCI. A large diagonal branch would have to be  crossed with a stent in the mid LAD, but the ostium of the diagonal is widely patent and I suspect there would be no significant residual stenosis after treating that area. However, the patient would require at least 3 stents (1 in the mid LAD, 2 in the left circumflex). In this patient who has developed type 2 diabetes and has shown no pattern of medical compliance as he continues to smoke and does not follow a diet, I'm not sure that percutaneous treatment is his best therapy. I am going to request consultation from cardiac surgery for consideration of coronary bypass. If he is deemed not to be a good candidate because of his morbid obesity, I will proceed with PCI later this week. In the interim, will hold his Plavix since surgery is being considered. He will be started on IV heparin. All of this was discussed with the patient and his family in detail.    Assessment and Plan  1. CAD - no current symptoms - continue secondary prevention  2. Hyperlipidemia - change to high dose statin given known history of CAD  3. HTN - at goal, continue current meds  4. GERD - have recommnended her start over the counter zantac.    F/u 6 months  Antoine Poche, M.D., F.A.C.C.

## 2014-11-27 NOTE — Patient Instructions (Signed)
   Increase Lipitor to 80mg  daily - may take 2 of your 40mg  tabs till finish current supply - new sent to pharm   Refills sent on Lasix, Lisinopril, & Metoprolol  Begin Zantac 150mg  twice a day  - may buy over the counter Continue all other medications.   Your physician wants you to follow up in: 6 months.  You will receive a reminder letter in the mail one-two months in advance.  If you don't receive a letter, please call our office to schedule the follow up appointment

## 2014-11-28 ENCOUNTER — Encounter: Payer: Self-pay | Admitting: *Deleted

## 2015-03-10 ENCOUNTER — Other Ambulatory Visit: Payer: Self-pay | Admitting: *Deleted

## 2015-03-10 MED ORDER — ATORVASTATIN CALCIUM 80 MG PO TABS: 80.0000 mg | ORAL_TABLET | Freq: Every day | ORAL | Status: DC

## 2015-03-10 MED ORDER — LISINOPRIL 10 MG PO TABS: 10.0000 mg | ORAL_TABLET | Freq: Every day | ORAL | Status: DC

## 2015-06-01 ENCOUNTER — Ambulatory Visit (INDEPENDENT_AMBULATORY_CARE_PROVIDER_SITE_OTHER): Payer: Medicare Other | Admitting: Cardiology

## 2015-06-01 ENCOUNTER — Ambulatory Visit: Payer: Medicare Other | Admitting: Cardiology

## 2015-06-01 ENCOUNTER — Encounter: Payer: Self-pay | Admitting: Cardiology

## 2015-06-01 VITALS — BP 130/70 | HR 68 | Ht 65.0 in | Wt 295.0 lb

## 2015-06-01 DIAGNOSIS — R0989 Other specified symptoms and signs involving the circulatory and respiratory systems: Secondary | ICD-10-CM

## 2015-06-01 DIAGNOSIS — I2510 Atherosclerotic heart disease of native coronary artery without angina pectoris: Secondary | ICD-10-CM

## 2015-06-01 DIAGNOSIS — I10 Essential (primary) hypertension: Secondary | ICD-10-CM

## 2015-06-01 DIAGNOSIS — E785 Hyperlipidemia, unspecified: Secondary | ICD-10-CM | POA: Diagnosis not present

## 2015-06-01 NOTE — Patient Instructions (Signed)
Your physician recommends that you schedule a follow-up appointment in: 6 MONTHS WITH DR BRANCH  Your physician recommends that you continue on your current medications as directed. Please refer to the Current Medication list given to you today.  Your physician has requested that you have a carotid duplex. This test is an ultrasound of the carotid arteries in your neck. It looks at blood flow through these arteries that supply the brain with blood. Allow one hour for this exam. There are no restrictions or special instructions.  Thank you for choosing [facility]!!    

## 2015-06-01 NOTE — Progress Notes (Signed)
Patient ID: [name], male   DOB: 05/15/1946, 69 y.o.   MRN: [id number]     Clinical Summary [name] is a 68 y.o.male seen today for follow up of the following medical problems.   1. CAD - prior stents in the past, CABG in 02/2012 4 vessel (LIMA-LAD,SVG-PDA,SVG-OM,SVG-diag). LVEF 55-65% by LV gram in 2013, do not see echo in system.   - denies any chest pain. Denies any SOB or DOE. - compliant with meds  2. Hyperlipidemia - compliant with statin 05/2015 TC 121 TG 78 LDL 71 HDL 34   3. Hypertension - does not check regularly at home    Past Medical History  Diagnosis Date  . Other malaise and fatigue   . Swelling of limb   . Edema   . Osteoarthrosis, unspecified whether generalized or localized, unspecified site   . Congestive heart failure, unspecified     EF 55-65% by cath 02/06/12  . CAD (coronary artery disease), native coronary artery 2006    Stent placed in 2006 in [place], [place]; Cath 02/06/12 Severe three-vessel coronary artery disease with critical stenosis in the mid LAD, severe stenosis of the left circumflex, and total occlusion of a small branch of the right coronary artery with moderate disease in the remaining portions of the RCA. Preserved LV function   . Pure hypercholesterolemia   . Hypertension   . Morbid obesity   . Tobacco abuse   . Myocardial infarction     DR COOPER   . Asthma     AS CHILD  . Shortness of breath     WITH EXERTION   . Diabetes mellitus      Allergies  Allergen Reactions  . Ciprofloxacin Hcl Other (See Comments)    Fluoroquinolones:difficulty swallowing and Sore mouth     Current Outpatient Prescriptions  Medication Sig Dispense Refill  . aspirin 81 MG chewable tablet Chew 81 mg by mouth daily.    [name] atorvastatin (LIPITOR) 80 MG tablet Take 1 tablet (80 mg total) by mouth daily. 30 tablet 6  . colchicine 0.6 MG tablet Take 0.6 mg by mouth 2 (two) times daily as needed. For gout flare    . furosemide (LASIX) 40 MG  tablet Take 1 tablet (40 mg total) by mouth daily. 30 tablet 6  . lisinopril (PRINIVIL,ZESTRIL) 10 MG tablet Take 1 tablet (10 mg total) by mouth daily. 30 tablet 6  . metoprolol tartrate (LOPRESSOR) 25 MG tablet Take 1 tablet (25 mg total) by mouth 2 (two) times daily. 60 tablet 6  . polyethylene glycol (MIRALAX / GLYCOLAX) packet Take 17 g by mouth daily as needed.     . ranitidine (ZANTAC) 150 MG tablet Take 1 tablet (150 mg total) by mouth 2 (two) times daily.     No current facility-administered medications for this visit.     Past Surgical History  Procedure Laterality Date  . Cardiac catheterization    . Coronary angioplasty with stent placement    . Coronary artery bypass graft  02/22/2012    Procedure: CORONARY ARTERY BYPASS GRAFTING (CABG);  Surgeon: Loreli Slot, MD;  Location: [facility] OR;  Service: Open Heart Surgery;  Laterality: N/A;  CABG x four;  using left internal mammary artery and right leg greater saphenous vein harvested endoscopically  . Left heart catheterization with coronary angiogram N/A 02/06/2012    Procedure: LEFT HEART CATHETERIZATION WITH CORONARY ANGIOGRAM;  Surgeon: Tonny Bollman, MD;  Location: [facility] CATH LAB;  Service: Cardiovascular;  Laterality:  N/A;     Allergies  Allergen Reactions  . Ciprofloxacin Hcl Other (See Comments)    Fluoroquinolones:difficulty swallowing and Sore mouth      No family history on file.   Social History [name] reports that he quit smoking about 3 years ago. His smoking use included Cigarettes. He started smoking about 56 years ago. He has a 20 pack-year smoking history. He has never used smokeless tobacco. [name] reports that he does not drink alcohol.   Review of Systems CONSTITUTIONAL: No weight loss, fever, chills, weakness or fatigue.  HEENT: Eyes: No visual loss, blurred vision, double vision or yellow sclerae.No hearing loss, sneezing, congestion, runny nose or sore throat.  SKIN: No rash or  itching.  CARDIOVASCULAR: per HPI RESPIRATORY: No shortness of breath, cough or sputum.  GASTROINTESTINAL: No anorexia, nausea, vomiting or diarrhea. No abdominal pain or blood.  GENITOURINARY: No burning on urination, no polyuria NEUROLOGICAL: No headache, dizziness, syncope, paralysis, ataxia, numbness or tingling in the extremities. No change in bowel or bladder control.  MUSCULOSKELETAL: No muscle, back pain, joint pain or stiffness.  LYMPHATICS: No enlarged nodes. No history of splenectomy.  PSYCHIATRIC: No history of depression or anxiety.  ENDOCRINOLOGIC: No reports of sweating, cold or heat intolerance. No polyuria or polydipsia.  [name].   Physical Examination Filed Vitals:   06/01/15 0934  BP: 130/70  Pulse: 68   Filed Weights   06/01/15 0934  Weight: 295 lb (133.811 kg)    Gen: resting comfortably, no acute distress HEENT: no scleral icterus, pupils equal round and reactive, no palptable cervical adenopathy,  CV: RRR, no m/r/g, no JVD, + bilateral carotid bruits Resp: Clear to auscultation bilaterally GI: abdomen is soft, non-tender, non-distended, normal bowel sounds, no hepatosplenomegaly MSK: extremities are warm, no edema.  Skin: warm, no rash Neuro:  no focal deficits Psych: appropriate affect   Diagnostic Studies  01/2012 Cath Procedural Findings: Hemodynamics: AO 114/61 LV 114/21  Coronary angiography: Coronary dominance: right  Left mainstem: The left main is patent with mild tapering at the distal vessel but no more than 20% stenosis is noted. The left main divides into the LAD and left circumflex.  Left anterior descending (LAD): The LAD has critical stenosis in the mid vessel. The proximal vessel has mild luminal irregularity until the first septal perforator. Just beyond the first perforator there is 95-99% stenosis present. There is a dilated segment just at the origin of the second diagonal branch. The LAD is segmentally diseased in the mid vessel  with 90% stenosis beyond the diagonal origin. The diagonal branch has no obstructive disease present. The distal LAD has diffuse nonobstructive disease.  Left circumflex (LCx): The left circumflex is patent in the proximal aspect. The mid vessel has sequential high-grade lesions with the first lesion having 75-80% stenosis and the second lesion with at least 80% stenosis leading into a large second obtuse marginal branch. The second OM also has a high-grade lesion in the distal portion of that vessel that is only appreciated in the cranial views.  Right coronary artery (RCA): The right coronary artery is a large, dominant vessel. The vessel is diffusely diseased with no high-grade obstruction through the proximal and midportion. At the junction of the mid and distal RCA there is a 60% smooth stenosis present. The vessel gives off a large PDA branch with diffuse disease in the 50-70% range throughout the proximal PDA. The posterolateral branch is occluded and the stent is not visible. The posterolateral branch fills  from left to right collaterals.  Left ventriculography: Left ventricular systolic function is normal, LVEF is estimated at 55-65%, there is no significant mitral regurgitation   Final Conclusions:  1. Severe three-vessel coronary artery disease with critical stenosis in the mid LAD, severe stenosis of the left circumflex, and total occlusion of a small branch of the right coronary artery with moderate disease in the remaining portions of the RCA. 2. Preserved LV function  Recommendations: The patient has a large amount of myocardium in jeopardy. He is coronary disease is anatomically amenable to PCI. A large diagonal branch would have to be crossed with a stent in the mid LAD, but the ostium of the diagonal is widely patent and I suspect there would be no significant residual stenosis after treating that area. However, the patient would require at least 3 stents (1 in the mid LAD, 2 in the  left circumflex). In this patient who has developed type 2 diabetes and has shown no pattern of medical compliance as he continues to smoke and does not follow a diet, I'm not sure that percutaneous treatment is his best therapy. I am going to request consultation from cardiac surgery for consideration of coronary bypass. If he is deemed not to be a good candidate because of his morbid obesity, I will proceed with PCI later this week. In the interim, will hold his Plavix since surgery is being considered. He will be started on IV heparin. All of this was discussed with the patient and his family in detail.     Assessment and Plan  1. CAD - no current symptoms - continue secondary prevention  2. Hyperlipidemia - continue high dose statin in setting of known CAD  3. HTN - at goal, continue current meds   4. Carotid bruits - obtain bilateral carotid US    F/u 6 months Antoine Poche, M.D.

## 2015-06-16 ENCOUNTER — Other Ambulatory Visit: Payer: Self-pay | Admitting: *Deleted

## 2015-06-16 MED ORDER — FUROSEMIDE 40 MG PO TABS: 40.0000 mg | ORAL_TABLET | Freq: Every day | ORAL | Status: DC

## 2015-06-16 MED ORDER — METOPROLOL TARTRATE 25 MG PO TABS: 25.0000 mg | ORAL_TABLET | Freq: Two times a day (BID) | ORAL | Status: DC

## 2015-06-25 ENCOUNTER — Ambulatory Visit (INDEPENDENT_AMBULATORY_CARE_PROVIDER_SITE_OTHER): Payer: Medicare Other

## 2015-06-25 DIAGNOSIS — R0989 Other specified symptoms and signs involving the circulatory and respiratory systems: Secondary | ICD-10-CM | POA: Diagnosis not present

## 2015-06-30 ENCOUNTER — Telehealth: Payer: Self-pay | Admitting: *Deleted

## 2015-06-30 NOTE — Telephone Encounter (Signed)
-----   Message from Antoine Poche, MD sent at 06/29/2015  9:34 AM EDT ----- Carotid US shows moderate blockages on both sides. We will continue to monitor, nothing new to do at this point  Dominga Ferry MD

## 2015-06-30 NOTE — Telephone Encounter (Signed)
Pt aware, routed to pcp 

## 2015-12-15 ENCOUNTER — Ambulatory Visit: Payer: Medicare Other | Admitting: Cardiology

## 2015-12-16 ENCOUNTER — Encounter: Payer: Self-pay | Admitting: Cardiology

## 2015-12-16 ENCOUNTER — Ambulatory Visit (INDEPENDENT_AMBULATORY_CARE_PROVIDER_SITE_OTHER): Payer: Medicare Other | Admitting: Cardiology

## 2015-12-16 ENCOUNTER — Encounter: Payer: Self-pay | Admitting: *Deleted

## 2015-12-16 VITALS — BP 121/78 | HR 57 | Ht 65.0 in | Wt 296.0 lb

## 2015-12-16 DIAGNOSIS — I10 Essential (primary) hypertension: Secondary | ICD-10-CM

## 2015-12-16 DIAGNOSIS — I2510 Atherosclerotic heart disease of native coronary artery without angina pectoris: Secondary | ICD-10-CM

## 2015-12-16 DIAGNOSIS — I6523 Occlusion and stenosis of bilateral carotid arteries: Secondary | ICD-10-CM | POA: Diagnosis not present

## 2015-12-16 DIAGNOSIS — E785 Hyperlipidemia, unspecified: Secondary | ICD-10-CM

## 2015-12-16 NOTE — Progress Notes (Signed)
Patient ID: TALIB HEADLEY, male   DOB: 04/18/1946, 70 y.o.   MRN: [id number]     Clinical Summary Mr. Soules is a 70 y.o.male seen today for follow up of the following medical problems.   1. CAD - prior stents in the past, CABG in 02/2012 4 vessel (LIMA-LAD,SVG-PDA,SVG-OM,SVG-diag). LVEF 55-65% by LV gram in 2013, do not see echo in system.   - denies any chest pain. No SOB or DOE - taken meds daily  2. Hyperlipidemia - compliant with statin 05/2015 TC 121 TG 78 LDL 71 HDL 34   3. 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  Past Medical History  Diagnosis Date  . Other malaise and fatigue   . Swelling of limb   . Edema   . Osteoarthrosis, unspecified whether generalized or localized, unspecified site   . Congestive heart failure, unspecified     EF 55-65% by cath 02/06/12  . CAD (coronary artery disease), native coronary artery 2006    Stent placed in 2006 in [place], [place]; Cath 02/06/12 Severe three-vessel coronary artery disease with critical stenosis in the mid LAD, severe stenosis of the left circumflex, and total occlusion of a small Evadne Ose of the right coronary artery with moderate disease in the remaining portions of the RCA. Preserved LV function   . Pure hypercholesterolemia   . Hypertension   . Morbid obesity   . Tobacco abuse   . Myocardial infarction (HCC)     DR COOPER   . Asthma     AS CHILD  . Shortness of breath     WITH EXERTION   . Diabetes mellitus      Allergies  Allergen Reactions  . Ciprofloxacin Hcl Other (See Comments)    Fluoroquinolones:difficulty swallowing and Sore mouth     Current Outpatient Prescriptions  Medication Sig Dispense Refill  . aspirin 81 MG chewable tablet Chew 81 mg by mouth daily.    [name] atorvastatin (LIPITOR) 80 MG tablet Take 1 tablet (80 mg total) by mouth daily. 30 tablet 6  . colchicine 0.6 MG  tablet Take 0.6 mg by mouth 2 (two) times daily as needed. For gout flare    . furosemide (LASIX) 40 MG tablet Take 1 tablet (40 mg total) by mouth daily. 30 tablet 6  . lisinopril (PRINIVIL,ZESTRIL) 10 MG tablet Take 1 tablet (10 mg total) by mouth daily. 30 tablet 6  . metoprolol tartrate (LOPRESSOR) 25 MG tablet Take 1 tablet (25 mg total) by mouth 2 (two) times daily. 60 tablet 6  . polyethylene glycol (MIRALAX / GLYCOLAX) packet Take 17 g by mouth daily as needed.     . ranitidine (ZANTAC) 150 MG tablet Take 1 tablet (150 mg total) by mouth 2 (two) times daily.     No current facility-administered medications for this visit.     Past Surgical History  Procedure Laterality Date  . Cardiac catheterization    . Coronary angioplasty with stent placement    . Coronary artery bypass graft  02/22/2012    Procedure: CORONARY ARTERY BYPASS GRAFTING (CABG);  Surgeon: Loreli Slot, MD;  Location: [facility] OR;  Service: Open Heart Surgery;  Laterality: N/A;  CABG x four;  using left internal mammary artery and right leg greater saphenous vein harvested endoscopically  . Left heart catheterization with coronary angiogram N/A 02/06/2012    Procedure:  LEFT HEART CATHETERIZATION WITH CORONARY ANGIOGRAM;  Surgeon: Tonny Bollman, MD;  Location[facility] C[facility] LAB;  Service: Cardiovascular;  Laterality: N/A;     Allergies  Allergen Reactions  . Ciprofloxacin Hcl Other (See Comments)    Fluoroquinolones:difficulty swallowing and Sore mouth      No family history on file.   Social History Mr. Mazzoni reports that he quit smoking about 3 years ago. His smoking use included Cigarettes. He started smoking about 56 years ago. He has a 20 pack-year smoking history. He has never used smokeless tobacco. Mr. Mottern reports that he does not drink alcohol.   Review of Systems CONSTITUTIONAL: No weight loss, fever, chills, weakness or fatigue.  HEENT: Eyes: No visual loss, blurred vision, double vision or  yellow sclerae.No hearing loss, sneezing, congestion, runny nose or sore throat.  SKIN: No rash or itching.  CARDIOVASCULAR: per HPI RESPIRATORY: No shortness of breath, cough or sputum.  GASTROINTESTINAL: No anorexia, nausea, vomiting or diarrhea. No abdominal pain or blood.  GENITOURINARY: No burning on urination, no polyuria NEUROLOGICAL: No headache, dizziness, syncope, paralysis, ataxia, numbness or tingling in the extremities. No change in bowel or bladder control.  MUSCULOSKELETAL: hand pain and stiffness LYMPHATICS: No enlarged nodes. No history of splenectomy.  PSYCHIATRIC: No history of depression or anxiety.  ENDOCRINOLOGIC: No reports of sweating, cold or heat intolerance. No polyuria or polydipsia.  [name]   Physical Examination Filed Vitals:   12/16/15 0936  BP: 121/78  Pulse: 57   Filed Vitals:   12/16/15 0936  Height:  (1.651 m)  Weight: 296 lb (134.265 kg)    Gen: resting comfortably, no acute distress HEENT: no scleral icterus, pupils equal round and reactive, no palptable cervical adenopathy,  CV: RRR, no m/r/g, no jvd Resp: Clear to auscultation bilaterally GI: abdomen is soft, non-tender, non-distended, normal bowel sounds, no hepatosplenomegaly MSK: extremities are warm, no edema.  Skin: warm, no rash Neuro:  no focal deficits Psych: appropriate affect   Diagnostic Studies 01/2012 Cath Procedural Findings: Hemodynamics: AO 114/61 LV 114/21  Coronary angiography: Coronary dominance: right  Left mainstem: The left main is patent with mild tapering at the distal vessel but no more than 20% stenosis is noted. The left main divides into the LAD and left circumflex.  Left anterior descending (LAD): The LAD has critical stenosis in the mid vessel. The proximal vessel has mild luminal irregularity until the first septal perforator. Just beyond the first perforator there is 95-99% stenosis present. There is a dilated segment just at the origin of the  second diagonal Omar Gayden. The LAD is segmentally diseased in the mid vessel with 90% stenosis beyond the diagonal origin. The diagonal Ambriel Gorelick has no obstructive disease present. The distal LAD has diffuse nonobstructive disease.  Left circumflex (LCx): The left circumflex is patent in the proximal aspect. The mid vessel has sequential high-grade lesions with the first lesion having 75-80% stenosis and the second lesion with at least 80% stenosis leading into a large second obtuse marginal Gennie Eisinger. The second OM also has a high-grade lesion in the distal portion of that vessel that is only appreciated in the cranial views.  Right coronary artery (RCA): The right coronary artery is a large, dominant vessel. The vessel is diffusely diseased with no high-grade obstruction through the proximal and midportion. At the junction of the mid and distal RCA there is a 60% smooth stenosis present. The vessel gives off a large PDA Horris Speros with diffuse disease in the 50-70% range throughout the  proximal PDA. The posterolateral Mckynlie Vanderslice is occluded and the stent is not visible. The posterolateral Cystal Shannahan fills from left to right collaterals.  Left ventriculography: Left ventricular systolic function is normal, LVEF is estimated at 55-65%, there is no significant mitral regurgitation   Final Conclusions:  1. Severe three-vessel coronary artery disease with critical stenosis in the mid LAD, severe stenosis of the left circumflex, and total occlusion of a small Kambree Krauss of the right coronary artery with moderate disease in the remaining portions of the RCA. 2. Preserved LV function  Recommendations: The patient has a large amount of myocardium in jeopardy. He is coronary disease is anatomically amenable to PCI. A large diagonal Madicyn Mesina would have to be crossed with a stent in the mid LAD, but the ostium of the diagonal is widely patent and I suspect there would be no significant residual stenosis after treating that area. However,  the patient would require at least 3 stents (1 in the mid LAD, 2 in the left circumflex). In this patient who has developed type 2 diabetes and has shown no pattern of medical compliance as he continues to smoke and does not follow a diet, I'm not sure that percutaneous treatment is his best therapy. I am going to request consultation from cardiac surgery for consideration of coronary bypass. If he is deemed not to be a good candidate because of his morbid obesity, I will proceed with PCI later this week. In the interim, will hold his Plavix since surgery is being considered. He will be started on IV heparin. All of this was discussed with the patient and his family in detail.   06/2015 Carotid US 40-59% bilateral ICA stenosis.  12/16/15 Clinic EKG (performed and reviewed in clinic): NSR  Assessment and Plan  1. CAD - no current symptoms - continue current meds  2. Hyperlipidemia - continue high dose statin in setting of known CAD - lipids at goal based on last panel  3. HTN - at goal, continue current meds   4. Carotid stenosis - moderate bilateral disease by 06/2015 [place], repeat in 1 year       Antoine Poche, M.D.

## 2015-12-16 NOTE — Patient Instructions (Signed)

## 2016-01-14 ENCOUNTER — Other Ambulatory Visit: Payer: Self-pay | Admitting: Cardiology

## 2016-06-14 ENCOUNTER — Other Ambulatory Visit: Payer: Self-pay | Admitting: Cardiology

## 2016-07-14 ENCOUNTER — Telehealth: Payer: Self-pay | Admitting: Cardiology

## 2016-07-14 NOTE — Telephone Encounter (Signed)
Contacted patient today by telephone trying to schedule a follow up appointment. No answer  Patient is due a1 yr cartoid fu (per report)  last one done 06-25-15. Mailed reminder to patient.

## 2016-08-10 ENCOUNTER — Other Ambulatory Visit: Payer: Self-pay | Admitting: Cardiology

## 2016-08-12 ENCOUNTER — Other Ambulatory Visit: Payer: Self-pay | Admitting: Cardiology

## 2017-01-11 ENCOUNTER — Ambulatory Visit (INDEPENDENT_AMBULATORY_CARE_PROVIDER_SITE_OTHER): Payer: Medicare Other | Admitting: Cardiology

## 2017-01-11 ENCOUNTER — Encounter: Payer: Self-pay | Admitting: Cardiology

## 2017-01-11 VITALS — BP 128/96 | HR 127 | Ht 66.0 in | Wt 316.0 lb

## 2017-01-11 DIAGNOSIS — I2510 Atherosclerotic heart disease of native coronary artery without angina pectoris: Secondary | ICD-10-CM | POA: Diagnosis not present

## 2017-01-11 DIAGNOSIS — I6523 Occlusion and stenosis of bilateral carotid arteries: Secondary | ICD-10-CM

## 2017-01-11 DIAGNOSIS — I10 Essential (primary) hypertension: Secondary | ICD-10-CM | POA: Diagnosis not present

## 2017-01-11 DIAGNOSIS — I4892 Unspecified atrial flutter: Secondary | ICD-10-CM

## 2017-01-11 DIAGNOSIS — E782 Mixed hyperlipidemia: Secondary | ICD-10-CM | POA: Diagnosis not present

## 2017-01-11 MED ORDER — APIXABAN 5 MG PO TABS: 5.0000 mg | ORAL_TABLET | Freq: Two times a day (BID) | ORAL | 3 refills | Status: DC

## 2017-01-11 MED ORDER — FUROSEMIDE 40 MG PO TABS: 40.0000 mg | ORAL_TABLET | Freq: Two times a day (BID) | ORAL | 3 refills | Status: DC

## 2017-01-11 MED ORDER — APIXABAN 5 MG PO TABS: 5.0000 mg | ORAL_TABLET | Freq: Two times a day (BID) | ORAL | 0 refills | Status: AC

## 2017-01-11 MED ORDER — METOPROLOL TARTRATE 50 MG PO TABS: 50.0000 mg | ORAL_TABLET | Freq: Two times a day (BID) | ORAL | 3 refills | Status: DC

## 2017-01-11 NOTE — Progress Notes (Signed)
Clinical Summary [name] is a 71 y.o.male seen today for follow up of the following medical problems.   1. CAD - prior stents in the past, CABG in 02/2012 4 vessel (LIMA-LAD,SVG-PDA,SVG-OM,SVG-diag). LVEF 55-65% by LV gram in 2013, do not see echo in system.   - denies any chest pain. - remains compliant with meds  2. Hyperlipidemia - compliant with statin   3. Hypertension - does not check bp at home - compliant with meds   4. Weight gain - reports no change in appetite - some abdominal distension, no LE edema. Some increased SOB. - weight up 20 lbs since last year.  - reports not limiting his sodium intake.     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    Past Medical History:  Diagnosis Date  . Asthma    AS CHILD  . CAD (coronary artery disease), native coronary artery 2006   Stent placed in 2006 in [place], [place]; Cath 02/06/12 Severe three-vessel coronary artery disease with critical stenosis in the mid LAD, severe stenosis of the left circumflex, and total occlusion of a small [name] of the right coronary artery with moderate disease in the remaining portions of the RCA. Preserved LV function   . Congestive heart failure, unspecified (HCC)    EF 55-65% by cath 02/06/12  . Diabetes mellitus   . Edema   . Hypertension   . Morbid obesity (HCC)   . Myocardial infarction    DR COOPER   . Osteoarthrosis, unspecified whether generalized or localized, unspecified site   . Other malaise and fatigue   . Pure hypercholesterolemia   . Shortness of breath    WITH EXERTION   . Swelling of limb   . Tobacco abuse      Allergies  Allergen Reactions  . Ciprofloxacin Hcl Other (See Comments)    Fluoroquinolones:difficulty swallowing and Sore mouth     Current Outpatient Prescriptions  Medication Sig Dispense Refill  . aspirin 81 MG chewable tablet Chew 81 mg by mouth daily.    [name] atorvastatin  (LIPITOR) 80 MG tablet TAKE 1 TABLET BY MOUTH EVERY DAY DOSE INCREASED 11-27-14 30 tablet 6  . colchicine 0.6 MG tablet Take 0.6 mg by mouth 2 (two) times daily as needed. For gout flare    . furosemide (LASIX) 40 MG tablet TAKE 1 TABLET BY MOUTH EVERY DAY 30 tablet 6  . furosemide (LASIX) 40 MG tablet TAKE 1 TABLET BY MOUTH EVERY DAY 30 tablet 6  . lisinopril (PRINIVIL,ZESTRIL) 10 MG tablet TAKE 1 TABLET BY MOUTH EVERY DAY 30 tablet 6  . metoprolol tartrate (LOPRESSOR) 25 MG tablet TAKE 1 TABLET (25 MG TOTAL) BY MOUTH 2 (TWO) TIMES DAILY. 60 tablet 6  . polyethylene glycol (MIRALAX / GLYCOLAX) packet Take 17 g by mouth daily as needed.     . ranitidine (ZANTAC) 150 MG tablet Take 150 mg by mouth 2 (two) times daily as needed for heartburn.     No current facility-administered medications for this visit.      Past Surgical History:  Procedure Laterality Date  . CARDIAC CATHETERIZATION    . CORONARY ANGIOPLASTY WITH STENT PLACEMENT    . CORONARY ARTERY BYPASS GRAFT  02/22/2012   Procedure: CORONARY ARTERY BYPASS GRAFTING (CABG);  Surgeon: Loreli Slot, MD;  Location: [facility] OR;  Service: Open Heart Surgery;  Laterality: N/A;  CABG x four;  using left internal mammary  artery and right leg greater saphenous vein harvested endoscopically  . LEFT HEART CATHETERIZATION WITH CORONARY ANGIOGRAM N/A 02/06/2012   Procedure: LEFT HEART CATHETERIZATION WITH CORONARY ANGIOGRAM;  Surgeon: Tonny BollmanMichael Cooper, MD;  [facility];  Service: Cardiovascular;  Laterality: N/A;     Allergies  Allergen Reactions  . Ciprofloxacin Hcl Other (See Comments)    Fluoroquinolones:difficulty swallowing and Sore mouth      No family history on file.   Social History [name] reports that he quit smoking about 4 years ago. His smoking use included Cigarettes. He started smoking about 57 years ago. He has a 20.00 pack-year smoking history. He has never used smokeless tobacco. [name] reports that he  does not drink alcohol.   Review of Systems CONSTITUTIONAL: No weight loss, fever, chills, weakness or fatigue.  HEENT: Eyes: No visual loss, blurred vision, double vision or yellow sclerae.No hearing loss, sneezing, congestion, runny nose or sore throat.  SKIN: No rash or itching.  CARDIOVASCULAR: per HPI RESPIRATORY: per HPI GASTROINTESTINAL: No anorexia, nausea, vomiting or diarrhea. No abdominal pain or blood.  GENITOURINARY: No burning on urination, no polyuria NEUROLOGICAL: No headache, dizziness, syncope, paralysis, ataxia, numbness or tingling in the extremities. No change in bowel or bladder control.  MUSCULOSKELETAL: No muscle, back pain, joint pain or stiffness.  LYMPHATICS: No enlarged nodes. No history of splenectomy.  PSYCHIATRIC: No history of depression or anxiety.  ENDOCRINOLOGIC: No reports of sweating, cold or heat intolerance. No polyuria or polydipsia.  [name].   Physical Examination Vitals:   01/11/17 0949  BP: (!) 128/96  Pulse: (!) 127   Vitals:   01/11/17 0949  Weight: (!) 316 lb (143.3 kg)  Height: 5\' 6"  (1.676 m)    Gen: resting comfortably, no acute distress HEENT: no scleral icterus, pupils equal round and reactive, no palptable cervical adenopathy,  CV: regular, rate 130, no m/r/g, no jvd Resp: Clear to auscultation bilaterally GI: abdomen is soft, non-tender, non-distended, normal bowel sounds, no hepatosplenomegaly MSK: extremities are warm, 1+ bilateral LE edema Skin: warm, no rash Neuro:  no focal deficits Psych: appropriate affect   Diagnostic Studies 01/2012 [facility] Procedural Findings: Hemodynamics: AO 114/61 LV 114/21  Coronary angiography: Coronary dominance: right  Left mainstem: The left main is patent with mild tapering at the distal vessel but no more than 20% stenosis is noted. The left main divides into the LAD and left circumflex.  Left anterior descending (LAD): The LAD has critical stenosis in the mid vessel. The proximal  vessel has mild luminal irregularity until the first septal perforator. Just beyond the first perforator there is 95-99% stenosis present. There is a dilated segment just at the origin of the second diagonal Derrian Poli. The LAD is segmentally diseased in the mid vessel with 90% stenosis beyond the diagonal origin. The diagonal Hana Trippett has no obstructive disease present. The distal LAD has diffuse nonobstructive disease.  Left circumflex (LCx): The left circumflex is patent in the proximal aspect. The mid vessel has sequential high-grade lesions with the first lesion having 75-80% stenosis and the second lesion with at least 80% stenosis leading into a large second obtuse marginal Astou Lada. The second OM also has a high-grade lesion in the distal portion of that vessel that is only appreciated in the cranial views.  Right coronary artery (RCA): The right coronary artery is a large, dominant vessel. The vessel is diffusely diseased with no high-grade obstruction through the proximal and midportion. At the junction of the mid and distal  RCA there is a 60% smooth stenosis present. The vessel gives off a large PDA Roni Friberg with diffuse disease in the 50-70% range throughout the proximal PDA. The posterolateral Storey Stangeland is occluded and the stent is not visible. The posterolateral Jerime Arif fills from left to right collaterals.  Left ventriculography: Left ventricular systolic function is normal, LVEF is estimated at 55-65%, there is no significant mitral regurgitation   Final Conclusions:  1. Severe three-vessel coronary artery disease with critical stenosis in the mid LAD, severe stenosis of the left circumflex, and total occlusion of a small Khayri Kargbo of the right coronary artery with moderate disease in the remaining portions of the RCA. 2. Preserved LV function  Recommendations: The patient has a large amount of myocardium in jeopardy. He is coronary disease is anatomically amenable to PCI. A large diagonal Aislynn Cifelli  would have to be crossed with a stent in the mid LAD, but the ostium of the diagonal is widely patent and I suspect there would be no significant residual stenosis after treating that area. However, the patient would require at least 3 stents (1 in the mid LAD, 2 in the left circumflex). In this patient who has developed type 2 diabetes and has shown no pattern of medical compliance as he continues to smoke and does not follow a diet, I'm not sure that percutaneous treatment is his best therapy. I am going to request consultation from cardiac surgery for consideration of coronary bypass. If he is deemed not to be a good candidate because of his morbid obesity, I will proceed with PCI later this week. In the interim, will hold his Plavix since surgery is being considered. He will be started on IV heparin. All of this was discussed with the patient and his family in detail.   06/2015 Carotid US 40-59% bilateral ICA stenosis.  12/16/15 Clinic EKG (performed and reviewed in clinic): NSR    Assessment and Plan  1. CAD - no current symptoms - he will continue current meds  2. Hyperlipidemia - continue high dose statin in setting of known CAD   3. HTN - he is at goal, continue current meds   4. Carotid stenosis - moderate bilateral disease by 06/2015 [place] - will need repeat later this year  5. Aflutter - new diagnosis this clinic visit. Heart rates elevated to 130s. EKG and extended rhythm strip done in clinci consistent with aflutter - will increase lopressor to 50mg  bid for better rate control - CHADS2Vasc score is 3, start eliquis 5mg  bid  6. Acute congestive heart failure - likely exacerbated by his aflutter and tachycardia - increase lasix to 40mg  bid. Check BMEt/Mg next week.  - check echo once heart rates better controlled.    F/u 1 week  Antoine Poche, M.D.

## 2017-01-11 NOTE — Patient Instructions (Signed)
Your physician recommends that you schedule a follow-up appointment in: 1 WEEK Wednesday 01/18/17 @140PM    Your physician has recommended you make the following change in your medication:   STOP ASPIRIN   INCREASE LASIX 40 MG TWICE DAILY  INCREASE METOPROLOL 50 MG TWICE DAILY   START ELIQUIS 5 MG TWICE DAILY - WE HAVE GIVEN YOU SAMPLES AND COUPON   Your physician recommends that you return for lab work in:  1 WEEK CBC/BMP/MG  [facility] choosing [facility]!!

## 2017-01-18 ENCOUNTER — Ambulatory Visit: Payer: Medicare Other | Admitting: Cardiology

## 2017-01-18 ENCOUNTER — Encounter: Payer: Self-pay | Admitting: Cardiology

## 2017-01-18 ENCOUNTER — Ambulatory Visit (INDEPENDENT_AMBULATORY_CARE_PROVIDER_SITE_OTHER): Payer: Medicare Other | Admitting: Cardiology

## 2017-01-18 VITALS — BP 128/82 | HR 127 | Ht 66.0 in | Wt 309.0 lb

## 2017-01-18 DIAGNOSIS — I4892 Unspecified atrial flutter: Secondary | ICD-10-CM | POA: Diagnosis not present

## 2017-01-18 DIAGNOSIS — I6523 Occlusion and stenosis of bilateral carotid arteries: Secondary | ICD-10-CM

## 2017-01-18 DIAGNOSIS — I5031 Acute diastolic (congestive) heart failure: Secondary | ICD-10-CM

## 2017-01-18 DIAGNOSIS — Z79899 Other long term (current) drug therapy: Secondary | ICD-10-CM | POA: Diagnosis not present

## 2017-01-18 MED ORDER — FUROSEMIDE 40 MG PO TABS: 40.0000 mg | ORAL_TABLET | Freq: Two times a day (BID) | ORAL | 3 refills | Status: DC

## 2017-01-18 MED ORDER — METOPROLOL TARTRATE 50 MG PO TABS: 75.0000 mg | ORAL_TABLET | Freq: Two times a day (BID) | ORAL | 3 refills | Status: DC

## 2017-01-18 NOTE — Progress Notes (Signed)
Clinical Summary [name] is a 71 y.o.male seen today for a focused visit on recent diagnosis of aflutter, as well as acute on chronic diastolic HF. For more detailed history please refer to prior notes.    1. 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   - last visit we increased lasix to 40mg  bid due to significant weight gain, abdominal distension, and SOB.  - weights down 7 lbs since last visit. Cr is stable.   2. 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  Past Medical History:  Diagnosis Date  . Asthma    AS CHILD  . CAD (coronary artery disease), native coronary artery 2006   Stent placed in 2006 in [place], [place]; Cath 02/06/12 Severe three-vessel coronary artery disease with critical stenosis in the mid LAD, severe stenosis of the left circumflex, and total occlusion of a small Keala Drum of the right coronary artery with moderate disease in the remaining portions of the RCA. Preserved LV function   . Congestive heart failure, unspecified    EF 55-65% by cath 02/06/12  . Diabetes mellitus   . Edema   . Hypertension   . Morbid obesity (HCC)   . Myocardial infarction    DR COOPER   . Osteoarthrosis, unspecified whether generalized or localized, unspecified site   . Other malaise and fatigue   . Pure hypercholesterolemia   . Shortness of breath    WITH EXERTION   . Swelling of limb   . Tobacco abuse      Allergies  Allergen Reactions  . Ciprofloxacin Hcl Other (See Comments)    Fluoroquinolones:difficulty swallowing and Sore mouth     Current Outpatient Prescriptions  Medication Sig Dispense Refill  . apixaban (ELIQUIS) 5 MG TABS tablet Take 1 tablet (5 mg total) by mouth 2 (two) times daily. 42 tablet 0  . atorvastatin (LIPITOR) 80 MG tablet TAKE 1 TABLET BY  MOUTH EVERY DAY DOSE INCREASED 11-27-14 30 tablet 6  . colchicine 0.6 MG tablet Take 0.6 mg by mouth 2 (two) times daily as needed. For gout flare    . furosemide (LASIX) 40 MG tablet Take 1 tablet (40 mg total) by mouth 2 (two) times daily. 180 tablet 3  . lisinopril (PRINIVIL,ZESTRIL) 10 MG tablet TAKE 1 TABLET BY MOUTH EVERY DAY 30 tablet 6  . metoprolol (LOPRESSOR) 50 MG tablet Take 1 tablet (50 mg total) by mouth 2 (two) times daily. 180 tablet 3  . polyethylene glycol (MIRALAX / GLYCOLAX) packet Take 17 g by mouth daily as needed.     . ranitidine (ZANTAC) 150 MG tablet Take 150 mg by mouth 2 (two) times daily as needed for heartburn.     No current facility-administered medications for this visit.      Past Surgical History:  Procedure Laterality Date  . CARDIAC CATHETERIZATION    . CORONARY ANGIOPLASTY WITH STENT PLACEMENT    . CORONARY ARTERY BYPASS GRAFT  02/22/2012   Procedure: CORONARY ARTERY BYPASS GRAFTING (CABG);  Surgeon: Loreli Slot, MD;  Location: [facility] OR;  Service: Open Heart Surgery;  Laterality: N/A;  CABG x four;  using left internal mammary artery and right leg greater saphenous vein harvested endoscopically  . LEFT HEART CATHETERIZATION WITH CORONARY ANGIOGRAM N/A 02/06/2012  Procedure: LEFT HEART CATHETERIZATION WITH CORONARY ANGIOGRAM;  Surgeon: Tonny Bollman, MD;  Location[facility] CATH LAB;  Service: Cardiovascular;  Laterality: N/A;     Allergies  Allergen Reactions  . Ciprofloxacin Hcl Other (See Comments)    Fluoroquinolones:difficulty swallowing and Sore mouth      Family History  Problem Relation Age of Onset  . Heart disease Mother   . Heart disease Sister      Social History [name] reports that he quit smoking about 4 years ago. His smoking use included Cigarettes. He started smoking about 57 years ago. He has a 20.00 pack-year smoking history. He has never used smokeless tobacco. [name] reports that he does not drink  alcohol.   Review of Systems CONSTITUTIONAL: No weight loss, fever, chills, weakness or fatigue.  HEENT: Eyes: No visual loss, blurred vision, double vision or yellow sclerae.No hearing loss, sneezing, congestion, runny nose or sore throat.  SKIN: No rash or itching.  CARDIOVASCULAR:no chest pain, no palpitatoins  RESPIRATORY: +SOB GASTROINTESTINAL: No anorexia, nausea, vomiting or diarrhea. No abdominal pain or blood.  GENITOURINARY: No burning on urination, no polyuria NEUROLOGICAL: No headache, dizziness, syncope, paralysis, ataxia, numbness or tingling in the extremities. No change in bowel or bladder control.  MUSCULOSKELETAL: No muscle, back pain, joint pain or stiffness.  LYMPHATICS: No enlarged nodes. No history of splenectomy.  PSYCHIATRIC: No history of depression or anxiety.  ENDOCRINOLOGIC: No reports of sweating, cold or heat intolerance. No polyuria or polydipsia.  [name]   Physical Examination Vitals:   01/18/17 1430  BP: 128/82  Pulse: (!) 127   Vitals:   01/18/17 1430  Weight: (!) 309 lb (140.2 kg)  Height: 5\' 6"  (1.676 m)    Gen: resting comfortably, no acute distress HEENT: no scleral icterus, pupils equal round and reactive, no palptable cervical adenopathy,  CV: regular, tachycardia 120 Resp: Clear to auscultation bilaterally GI: abdomen is soft, non-tender, non-distended, normal bowel sounds, no hepatosplenomegaly MSK: extremities are warm, no edema.  Skin: warm, no rash Neuro:  no focal deficits Psych: appropriate affect   Diagnostic Studies 01/2012 [facility] Procedural Findings: Hemodynamics: AO 114/61 LV 114/21  Coronary angiography: Coronary dominance: right  Left mainstem: The left main is patent with mild tapering at the distal vessel but no more than 20% stenosis is noted. The left main divides into the LAD and left circumflex.  Left anterior descending (LAD): The LAD has critical stenosis in the mid vessel. The proximal vessel has mild  luminal irregularity until the first septal perforator. Just beyond the first perforator there is 95-99% stenosis present. There is a dilated segment just at the origin of the second diagonal Furkan Keenum. The LAD is segmentally diseased in the mid vessel with 90% stenosis beyond the diagonal origin. The diagonal Kaysin Brock has no obstructive disease present. The distal LAD has diffuse nonobstructive disease.  Left circumflex (LCx): The left circumflex is patent in the proximal aspect. The mid vessel has sequential high-grade lesions with the first lesion having 75-80% stenosis and the second lesion with at least 80% stenosis leading into a large second obtuse marginal Kandiss Ihrig. The second OM also has a high-grade lesion in the distal portion of that vessel that is only appreciated in the cranial views.  Right coronary artery (RCA): The right coronary artery is a large, dominant vessel. The vessel is diffusely diseased with no high-grade obstruction through the proximal and midportion. At the junction of the mid and distal RCA there is a 60% smooth stenosis present.  The vessel gives off a large PDA Jewels Langone with diffuse disease in the 50-70% range throughout the proximal PDA. The posterolateral Paije Goodhart is occluded and the stent is not visible. The posterolateral Paisly Fingerhut fills from left to right collaterals.  Left ventriculography: Left ventricular systolic function is normal, LVEF is estimated at 55-65%, there is no significant mitral regurgitation   Final Conclusions:  1. Severe three-vessel coronary artery disease with critical stenosis in the mid LAD, severe stenosis of the left circumflex, and total occlusion of a small Bertha Earwood of the right coronary artery with moderate disease in the remaining portions of the RCA. 2. Preserved LV function  Recommendations: The patient has a large amount of myocardium in jeopardy. He is coronary disease is anatomically amenable to PCI. A large diagonal Hill Mackie would have to be  crossed with a stent in the mid LAD, but the ostium of the diagonal is widely patent and I suspect there would be no significant residual stenosis after treating that area. However, the patient would require at least 3 stents (1 in the mid LAD, 2 in the left circumflex). In this patient who has developed type 2 diabetes and has shown no pattern of medical compliance as he continues to smoke and does not follow a diet, I'm not sure that percutaneous treatment is his best therapy. I am going to request consultation from cardiac surgery for consideration of coronary bypass. If he is deemed not to be a good candidate because of his morbid obesity, I will proceed with PCI later this week. In the interim, will hold his Plavix since surgery is being considered. He will be started on IV heparin. All of this was discussed with the patient and his family in detail.   06/2015 Carotid US 40-59% bilateral ICA stenosis.  12/16/15 Clinic EKG (performed and reviewed in clinic): NSR    Assessment and Plan  1. . Aflutter - new diagnosis last clinic visit.  EKG and extended rhythm strip at that time showed aflutter - heart rates remain elevated, will increase lopressor to 75 mg bid for better rate control - CHADS2Vasc score is 3, continue eliquis 5mg  bid  2. Acute diastolic congestive heart failure - likely exacerbated by his aflutter and tachycardia - down 7 lbs since last visit, continue lasix 40mg  bid.   - check echo once heart rates better controlled.     F/u 2 weeks      Antoine PocheJonathan F. Kataleyah Carducci, M.D.,

## 2017-01-18 NOTE — Patient Instructions (Signed)
Medication Instructions:  INCREASE METOPROLOL TO 75 MG TWO TIMES DAILY    Labwork: Your physician recommends that you return for lab work in: 2 WEEKS  BMET MAGNESIUM    Testing/Procedures: NONE  Follow-Up: Your physician recommends that you schedule a follow-up appointment in: February 02, 2017 @ 9:20 WITH DR. BRANCH    Any Other Special Instructions Will Be Listed Below (If Applicable).     If you need a refill on your cardiac medications before your next appointment, please call your pharmacy.

## 2017-02-01 ENCOUNTER — Telehealth: Payer: Self-pay | Admitting: *Deleted

## 2017-02-01 DIAGNOSIS — I10 Essential (primary) hypertension: Secondary | ICD-10-CM

## 2017-02-01 NOTE — Telephone Encounter (Signed)
-----   Message from Lesle ChrisAngela G Hill, LPN sent at 5/40/98113/20/2018  4:10 PM EDT -----   ----- Message ----- From: Antoine PocheJonathan F Branch, MD Sent: 01/31/2017  12:48 PM To: Lesle ChrisAngela G Hill, LPN  Labs show mild decrease in kidney function. Please verify he has been taking lasix 40mg  bid and lower to 40mg  in AM and 20mg  in PM. Repeat BMET/Mg in 2 weeks Dominga FerryJ Branch MD

## 2017-02-01 NOTE — Telephone Encounter (Signed)
Pt voiced understanding - says he has take 40 mg this morning and will take 20 mg this afternoon - will give pt lab orders at appt tomorrow - routed to pcp

## 2017-02-02 ENCOUNTER — Ambulatory Visit (INDEPENDENT_AMBULATORY_CARE_PROVIDER_SITE_OTHER): Payer: Medicare Other | Admitting: Cardiology

## 2017-02-02 VITALS — BP 114/72 | HR 89 | Ht 66.0 in | Wt 317.8 lb

## 2017-02-02 DIAGNOSIS — I4892 Unspecified atrial flutter: Secondary | ICD-10-CM

## 2017-02-02 DIAGNOSIS — I5031 Acute diastolic (congestive) heart failure: Secondary | ICD-10-CM | POA: Diagnosis not present

## 2017-02-02 DIAGNOSIS — I6523 Occlusion and stenosis of bilateral carotid arteries: Secondary | ICD-10-CM | POA: Diagnosis not present

## 2017-02-02 MED ORDER — METOPROLOL TARTRATE 100 MG PO TABS: 100.0000 mg | ORAL_TABLET | Freq: Two times a day (BID) | ORAL | 3 refills | Status: DC

## 2017-02-02 MED ORDER — FUROSEMIDE 40 MG PO TABS: 40.0000 mg | ORAL_TABLET | Freq: Two times a day (BID) | ORAL | 3 refills | Status: AC

## 2017-02-02 NOTE — Progress Notes (Signed)
Clinical Summary [name] is a 71 y.o.male seen today for follow up of the following medical problems.   1. 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  - had lost 7 lbs last visit with increased diuretics. Today weight back up 8 lbs. Increased edema, ongoing SOB.     2. Aflutter - new diagnosis last visit, rates elevated 130s - we increased his lopressor to 50mg  bid, started eliquis 5mg  bid for CHADS2Vasc score of 3 - no recent palpitaitons.     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  Past Medical History:  Diagnosis Date  . Asthma    AS CHILD  . CAD (coronary artery disease), native coronary artery 2006   Stent placed in 2006 in [place], [place]; Cath 02/06/12 Severe three-vessel coronary artery disease with critical stenosis in the mid LAD, severe stenosis of the left circumflex, and total occlusion of a small Jabriel Vanduyne of the right coronary artery with moderate disease in the remaining portions of the RCA. Preserved LV function   . Congestive heart failure, unspecified    EF 55-65% by cath 02/06/12  . Diabetes mellitus   . Edema   . Hypertension   . Morbid obesity (HCC)   . Myocardial infarction    DR COOPER   . Osteoarthrosis, unspecified whether generalized or localized, unspecified site   . Other malaise and fatigue   . Pure hypercholesterolemia   . Shortness of breath    WITH EXERTION   . Swelling of limb   . Tobacco abuse      Allergies  Allergen Reactions  . Ciprofloxacin Hcl Other (See Comments)    Fluoroquinolones:difficulty swallowing and Sore mouth     Current Outpatient Prescriptions  Medication Sig Dispense Refill  . apixaban (ELIQUIS) 5 MG TABS tablet Take 1 tablet (5 mg total) by mouth 2 (two) times daily. 42 tablet 0  . atorvastatin (LIPITOR) 80 MG tablet TAKE 1 TABLET BY MOUTH EVERY DAY DOSE INCREASED 11-27-14 30 tablet 6  . colchicine 0.6 MG tablet Take 0.6 mg by mouth 2 (two) times daily as  needed. For gout flare    . furosemide (LASIX) 40 MG tablet TAKE 40 MG IN THE MORNING AND 20 MG IN THE EVENING    . lisinopril (PRINIVIL,ZESTRIL) 10 MG tablet TAKE 1 TABLET BY MOUTH EVERY DAY 30 tablet 6  . metoprolol (LOPRESSOR) 50 MG tablet Take 1.5 tablets (75 mg total) by mouth 2 (two) times daily. 180 tablet 3  . polyethylene glycol (MIRALAX / GLYCOLAX) packet Take 17 g by mouth daily as needed.     . ranitidine (ZANTAC) 150 MG tablet Take 150 mg by mouth 2 (two) times daily as needed for heartburn.     No current facility-administered medications for this visit.      Past Surgical History:  Procedure Laterality Date  . CARDIAC CATHETERIZATION    . CORONARY ANGIOPLASTY WITH STENT PLACEMENT    . CORONARY ARTERY BYPASS GRAFT  02/22/2012   Procedure: CORONARY ARTERY BYPASS GRAFTING (CABG);  Surgeon: Loreli Slot, MD;  Location: [facility] OR;  Service: Open Heart Surgery;  Laterality: N/A;  CABG x four;  using left internal mammary artery and right leg greater saphenous vein harvested endoscopically  . LEFT HEART CATHETERIZATION WITH CORONARY ANGIOGRAM N/A 02/06/2012   Procedure: LEFT HEART CATHETERIZATION WITH CORONARY ANGIOGRAM;  Surgeon: Tonny Bollman, MD;  Location: [facility];  Service: Cardiovascular;  Laterality: N/A;  Allergies  Allergen Reactions  . Ciprofloxacin Hcl Other (See Comments)    Fluoroquinolones:difficulty swallowing and Sore mouth      Family History  Problem Relation Age of Onset  . Heart disease Mother   . Heart disease Sister      Social History [name] reports that he quit smoking about 4 years ago. His smoking use included Cigarettes. He started smoking about 57 years ago. He has a 20.00 pack-year smoking history. He has never used smokeless tobacco. [name] reports that he does not drink alcohol.   Review of Systems CONSTITUTIONAL: No weight loss, fever, chills, weakness or fatigue.  HEENT: Eyes: No visual loss, blurred vision,  double vision or yellow sclerae.No hearing loss, sneezing, congestion, runny nose or sore throat.  SKIN: No rash or itching.  CARDIOVASCULAR: per hpi RESPIRATORY: No shortness of breath, cough or sputum.  GASTROINTESTINAL: No anorexia, nausea, vomiting or diarrhea. No abdominal pain or blood.  GENITOURINARY: No burning on urination, no polyuria NEUROLOGICAL: No headache, dizziness, syncope, paralysis, ataxia, numbness or tingling in the extremities. No change in bowel or bladder control.  MUSCULOSKELETAL: No muscle, back pain, joint pain or stiffness.  LYMPHATICS: No enlarged nodes. No history of splenectomy.  PSYCHIATRIC: No history of depression or anxiety.  ENDOCRINOLOGIC: No reports of sweating, cold or heat intolerance. No polyuria or polydipsia.  [name]   Physical Examination Vitals:   02/02/17 1515  BP: 114/72  Pulse: 89   Vitals:   02/02/17 1515  Weight: (!) 317 lb 12.8 oz (144.2 kg)  Height: 5\' 6"  (1.676 m)    Gen: resting comfortably, no acute distress HEENT: no scleral icterus, pupils equal round and reactive, no palptable cervical adenopathy,  CV: RRR, no m/rg, no jvd Resp: Clear to auscultation bilaterally GI: abdomen is soft, non-tender, non-distended, normal bowel sounds, no hepatosplenomegaly MSK: extremities are warm, no edema.  Skin: warm, no rash Neuro:  no focal deficits Psych: appropriate affect   Diagnostic Studies 01/2012 Cath Procedural Findings: Hemodynamics: AO 114/61 LV 114/21  Coronary angiography: Coronary dominance: right  Left mainstem: The left main is patent with mild tapering at the distal vessel but no more than 20% stenosis is noted. The left main divides into the LAD and left circumflex.  Left anterior descending (LAD): The LAD has critical stenosis in the mid vessel. The proximal vessel has mild luminal irregularity until the first septal perforator. Just beyond the first perforator there is 95-99% stenosis present. There is a  dilated segment just at the origin of the second diagonal Johneisha Broaden. The LAD is segmentally diseased in the mid vessel with 90% stenosis beyond the diagonal origin. The diagonal Neeya Prigmore has no obstructive disease present. The distal LAD has diffuse nonobstructive disease.  Left circumflex (LCx): The left circumflex is patent in the proximal aspect. The mid vessel has sequential high-grade lesions with the first lesion having 75-80% stenosis and the second lesion with at least 80% stenosis leading into a large second obtuse marginal Kimbly Eanes. The second OM also has a high-grade lesion in the distal portion of that vessel that is only appreciated in the cranial views.  Right coronary artery (RCA): The right coronary artery is a large, dominant vessel. The vessel is diffusely diseased with no high-grade obstruction through the proximal and midportion. At the junction of the mid and distal RCA there is a 60% smooth stenosis present. The vessel gives off a large PDA Caliegh Middlekauff with diffuse disease in the 50-70% range throughout the proximal PDA. The  posterolateral Orlena Garmon is occluded and the stent is not visible. The posterolateral Gloria Lambertson fills from left to right collaterals.  Left ventriculography: Left ventricular systolic function is normal, LVEF is estimated at 55-65%, there is no significant mitral regurgitation   Final Conclusions:  1. Severe three-vessel coronary artery disease with critical stenosis in the mid LAD, severe stenosis of the left circumflex, and total occlusion of a small Urvi Imes of the right coronary artery with moderate disease in the remaining portions of the RCA. 2. Preserved LV function  Recommendations: The patient has a large amount of myocardium in jeopardy. He is coronary disease is anatomically amenable to PCI. A large diagonal Elizabeht Suto would have to be crossed with a stent in the mid LAD, but the ostium of the diagonal is widely patent and I suspect there would be no significant  residual stenosis after treating that area. However, the patient would require at least 3 stents (1 in the mid LAD, 2 in the left circumflex). In this patient who has developed type 2 diabetes and has shown no pattern of medical compliance as he continues to smoke and does not follow a diet, I'm not sure that percutaneous treatment is his best therapy. I am going to request consultation from cardiac surgery for consideration of coronary bypass. If he is deemed not to be a good candidate because of his morbid obesity, I will proceed with PCI later this week. In the interim, will hold his Plavix since surgery is being considered. He will be started on IV heparin. All of this was discussed with the patient and his family in detail.   06/2015 Carotid US 40-59% bilateral ICA stenosis.  12/16/15 Clinic EKG (performed and reviewed in clinic): NSR     Assessment and Plan   1. . Aflutter - ekg in clinic shows aflutter, elevated rates. We will increase lopressor to 100mg  bid - CHADS2Vasc score is 3, continue eliquis 5mg  bid - he is to contact us next week to arrange DCCV  2. Acute diastolic congestive heart failure - likely exacerbated by his aflutter and tachycardia - weight is back up since last visit. We will increaes lasix again to 40mg  bid.          Antoine PocheJonathan F. Rether Rison, M.D.

## 2017-02-02 NOTE — Patient Instructions (Signed)
Your physician recommends that you schedule a follow-up appointment in: 3 WEEKS WITH DR. BRANCH   Your physician has recommended you make the following change in your medication: INCREASE LASIX 40MG  TWICE A DAY AND INCREASE METOPROLOL 100MG  TWICE A DAY   PLEASE CALL US IN 2 WEEKS TO SCHEDULE YOUR CARDIOVERSION

## 2017-02-04 ENCOUNTER — Encounter: Payer: Self-pay | Admitting: Cardiology

## 2017-02-06 ENCOUNTER — Telehealth: Payer: Self-pay | Admitting: Cardiology

## 2017-02-06 ENCOUNTER — Telehealth: Payer: Self-pay | Admitting: *Deleted

## 2017-02-06 DIAGNOSIS — I10 Essential (primary) hypertension: Secondary | ICD-10-CM

## 2017-02-06 NOTE — Telephone Encounter (Signed)
Pt ready to schedule DCCV as per LOV - message sent to schedulers - pt says did not matter on date/time

## 2017-02-06 NOTE — Telephone Encounter (Signed)
Patient concerned about fluid  ALso wants to talk about getting procedure done

## 2017-02-06 NOTE — Telephone Encounter (Signed)
[name]  [name], CMA        Pre op scheduled for 2:15pm tomorrow (3/27) [facility].    This Thursday 02/09/17 @ 9am. Register @ [facility] @ 7:30am.   Pt aware and will have labs done tomorrow as well

## 2017-02-06 NOTE — Patient Instructions (Signed)
[name] [name]  02/06/2017     @PREFPERIOPPHARMACY @   Your procedure is scheduled on  02/09/2017  Report to [facility] at  730  A.M.  Call this number if you have problems the morning of surgery:  [phone]   Remember:  Do not eat food or drink liquids after midnight.  Take these medicines the morning of surgery with A SIP OF WATER  Lisinopril, zantac. Continue to take your eliquis. DO NOT take your metoprolol the morning of your procedure.   Do not wear jewelry, make-up or nail polish.  Do not wear lotions, powders, or perfumes, or deoderant.  Do not shave 48 hours prior to surgery.  Men may shave face and neck.  Do not bring valuables to the hospital.  [facility] is not responsible for any belongings or valuables.  Contacts, dentures or bridgework may not be worn into surgery.  Leave your suitcase in the car.  After surgery it may be brought to your room.  For patients admitted to the hospital, discharge time will be determined by your treatment team.  Patients discharged the day of surgery will not be allowed to drive home.   Name and phone number of your driver:   family Special instructions:  None  Please read over the following fact sheets that you were given. Anesthesia Post-op Instructions and Care and Recovery After Surgery       Electrical Cardioversion Electrical cardioversion is the delivery of a jolt of electricity to restore a normal rhythm to the heart. A rhythm that is too fast or is not regular keeps the heart from pumping well. In this procedure, sticky patches or metal paddles are placed on the chest to deliver electricity to the heart from a device. This procedure may be done in an emergency if:  There is low or no blood pressure as a result of the heart rhythm.  Normal rhythm must be restored as fast as possible to protect the brain and heart from further damage.  It may save a life. This procedure may also be done for irregular  or fast heart rhythms that are not immediately life-threatening. Tell a health care provider about:  Any allergies you have.  All medicines you are taking, including vitamins, herbs, eye drops, creams, and over-the-counter medicines.  Any problems you or family members have had with anesthetic medicines.  Any blood disorders you have.  Any surgeries you have had.  Any medical conditions you have.  Whether you are pregnant or may be pregnant. What are the risks? Generally, this is a safe procedure. However, problems may occur, including:  Allergic reactions to medicines.  A blood clot that breaks free and travels to other parts of your body.  The possible return of an abnormal heart rhythm within hours or days after the procedure.  Your heart stopping (cardiac arrest). This is rare. What happens before the procedure? Medicines   Your health care provider may have you start taking:  Blood-thinning medicines (anticoagulants) so your blood does not clot as easily.  Medicines may be given to help stabilize your heart rate and rhythm.  Ask your health care provider about changing or stopping your regular medicines. This is especially important if you are taking diabetes medicines or blood thinners. General instructions   Plan to have someone take you home from the hospital or clinic.  If you will be going home right after the procedure, plan to  have someone with you for 24 hours.  Follow instructions from your health care provider about eating or drinking restrictions. What happens during the procedure?  To lower your risk of infection:  Your health care team will wash or sanitize their hands.  Your skin will be washed with soap.  An IV tube will be inserted into one of your veins.  You will be given a medicine to help you relax (sedative).  Sticky patches (electrodes) or metal paddles may be placed on your chest.  An electrical shock will be delivered. The  procedure may vary among health care providers and hospitals. What happens after the procedure?  Your blood pressure, heart rate, breathing rate, and blood oxygen level will be monitored until the medicines you were given have worn off.  Do not drive for 24 hours if you were given a sedative.  Your heart rhythm will be watched to make sure it does not change. This information is not intended to replace advice given to you by your health care provider. Make sure you discuss any questions you have with your health care provider. Document Released: 10/21/2002 Document Revised: 06/29/2016 Document Reviewed: 05/06/2016 Elsevier Interactive Patient Education  2017 [employer].  Electrical Cardioversion, Care After This sheet gives you information about how to care for yourself after your procedure. Your health care provider may also give you more specific instructions. If you have problems or questions, contact your health care provider. What can I expect after the procedure? After the procedure, it is common to have:  Some redness on the skin where the shocks were given. Follow these instructions at home:  Do not drive for 24 hours if you were given a medicine to help you relax (sedative).  Take over-the-counter and prescription medicines only as told by your health care provider.  Ask your health care provider how to check your pulse. Check it often.  Rest for 48 hours after the procedure or as told by your health care provider.  Avoid or limit your caffeine use as told by your health care provider. Contact a health care provider if:  You feel like your heart is beating too quickly or your pulse is not regular.  You have a serious muscle cramp that does not go away. Get help right away if:  You have discomfort in your chest.  You are dizzy or you feel faint.  You have trouble breathing or you are short of breath.  Your speech is slurred.  You have trouble moving an arm or  leg on one side of your body.  Your fingers or toes turn cold or blue. This information is not intended to replace advice given to you by your health care provider. Make sure you discuss any questions you have with your health care provider. Document Released: 08/21/2013 Document Revised: 06/03/2016 Document Reviewed: 05/06/2016 Elsevier Interactive Patient Education  2017 [employer]  Monitored Anesthesia Care Anesthesia is a term that refers to techniques, procedures, and medicines that help a person stay safe and comfortable during a medical procedure. Monitored anesthesia care, or sedation, is one type of anesthesia. Your anesthesia specialist may recommend sedation if you will be having a procedure that does not require you to be unconscious, such as:  Cataract surgery.  A dental procedure.  A biopsy.  A colonoscopy. During the procedure, you may receive a medicine to help you relax (sedative). There are three levels of sedation:  Mild sedation. At this level, you may feel awake and  relaxed. You will be able to follow directions.  Moderate sedation. At this level, you will be sleepy. You may not remember the procedure.  Deep sedation. At this level, you will be asleep. You will not remember the procedure. The more medicine you are given, the deeper your level of sedation will be. Depending on how you respond to the procedure, the anesthesia specialist may change your level of sedation or the type of anesthesia to fit your needs. An anesthesia specialist will monitor you closely during the procedure. Let your health care provider know about:  Any allergies you have.  All medicines you are taking, including vitamins, herbs, eye drops, creams, and over-the-counter medicines.  Any use of steroids (by mouth or as a cream).  Any problems you or family members have had with sedatives and anesthetic medicines.  Any blood disorders you have.  Any surgeries you have had.  Any  medical conditions you have, such as sleep apnea.  Whether you are pregnant or may be pregnant.  Any use of cigarettes, alcohol, or street drugs. What are the risks? Generally, this is a safe procedure. However, problems may occur, including:  Getting too much medicine (oversedation).  Nausea.  Allergic reaction to medicines.  Trouble breathing. If this happens, a breathing tube may be used to help with breathing. It will be removed when you are awake and breathing on your own.  Heart trouble.  Lung trouble. Before the procedure Staying hydrated  Follow instructions from your health care provider about hydration, which may include:  Up to 2 hours before the procedure - you may continue to drink clear liquids, such as water, clear fruit juice, black coffee, and plain tea. Eating and drinking restrictions  Follow instructions from your health care provider about eating and drinking, which may include:  8 hours before the procedure - stop eating heavy meals or foods such as meat, fried foods, or fatty foods.  6 hours before the procedure - stop eating light meals or foods, such as toast or cereal.  6 hours before the procedure - stop drinking milk or drinks that contain milk.  2 hours before the procedure - stop drinking clear liquids. Medicines  Ask your health care provider about:  Changing or stopping your regular medicines. This is especially important if you are taking diabetes medicines or blood thinners.  Taking medicines such as aspirin and ibuprofen. These medicines can thin your blood. Do not take these medicines before your procedure if your health care provider instructs you not to. Tests and exams  You will have a physical exam.  You may have blood tests done to show:  How well your kidneys and liver are working.  How well your blood can clot.  General instructions  Plan to have someone take you home from the hospital or clinic.  If you will be going  home right after the procedure, plan to have someone with you for 24 hours. What happens during the procedure?  Your blood pressure, heart rate, breathing, level of pain and overall condition will be monitored.  An IV tube will be inserted into one of your veins.  Your anesthesia specialist will give you medicines as needed to keep you comfortable during the procedure. This may mean changing the level of sedation.  The procedure will be performed. After the procedure  Your blood pressure, heart rate, breathing rate, and blood oxygen level will be monitored until the medicines you were given have worn off.  Do not  drive for 24 hours if you received a sedative.  You may:  Feel sleepy, clumsy, or nauseous.  Feel forgetful about what happened after the procedure.  Have a sore throat if you had a breathing tube during the procedure.  Vomit. This information is not intended to replace advice given to you by your health care provider. Make sure you discuss any questions you have with your health care provider. Document Released: 07/27/2005 Document Revised: 04/08/2016 Document Reviewed: 02/21/2016 Elsevier Interactive Patient Education  2017 Else[employer] Inc[employer] Monitored Anesthesia Care, Care After These instructions provide you with information about caring for yourself after your procedure. Your health care provider may also give you more specific instructions. Your treatment has been planned according to current medical practices, but problems sometimes occur. Call your health care provider if you have any problems or questions after your procedure. What can I expect after the procedure? After your procedure, it is common to:  Feel sleepy for several hours.  Feel clumsy and have poor balance for several hours.  Feel forgetful about what happened after the procedure.  Have poor judgment for several hours.  Feel nauseous or vomit.  Have a sore throat if you had a breathing tube during  the procedure. Follow these instructions at home: For at least 24 hours after the procedure:    Do not:  Participate in activities in which you could fall or become injured.  Drive.  Use heavy machinery.  Drink alcohol.  Take sleeping pills or medicines that cause drowsiness.  Make important decisions or sign legal documents.  Take care of children on your own.  Rest. Eating and drinking   Follow the diet that is recommended by your health care provider.  If you vomit, drink water, juice, or soup when you can drink without vomiting.  Make sure you have little or no nausea before eating solid foods. General instructions   Have a responsible adult stay with you until you are awake and alert.  Take over-the-counter and prescription medicines only as told by your health care provider.  If you smoke, do not smoke without supervision.  Keep all follow-up visits as told by your health care provider. This is important. Contact a health care provider if:  You keep feeling nauseous or you keep vomiting.  You feel light-headed.  You develop a rash.  You have a fever. Get help right away if:  You have trouble breathing. This information is not intended to replace advice given to you by your health care provider. Make sure you discuss any questions you have with your health care provider. Document Released: 02/21/2016 Document Revised: 06/22/2016 Document Reviewed: 02/21/2016 Elsevier Interactive Patient Education  2017 [employer].

## 2017-02-06 NOTE — Telephone Encounter (Signed)
Can he update us on his weights and swelling. Ideally we would get some fluid off before the procedure. Ideally would plan for later this week, possibly Friday, and make adjustments with his fluid medicines as needed prior   Dominga FerryJ Kaelem Brach MD

## 2017-02-06 NOTE — Telephone Encounter (Signed)
Pt says swelling has gone down "may a little not much" pt says he was having his niece help him weigh and would call back tomorrow with weight for today and tomorrow

## 2017-02-07 ENCOUNTER — Encounter (HOSPITAL_COMMUNITY): Payer: Self-pay

## 2017-02-07 ENCOUNTER — Encounter (HOSPITAL_COMMUNITY)
Admission: RE | Admit: 2017-02-07 | Discharge: 2017-02-07 | Disposition: A | Discharge status: Home / Self Care | Payer: Medicare Other | Source: Ambulatory Visit | Attending: Cardiology | Admitting: Cardiology

## 2017-02-07 ENCOUNTER — Other Ambulatory Visit: Payer: Self-pay | Admitting: Cardiology

## 2017-02-07 DIAGNOSIS — I252 Old myocardial infarction: Secondary | ICD-10-CM | POA: Diagnosis not present

## 2017-02-07 DIAGNOSIS — I5033 Acute on chronic diastolic (congestive) heart failure: Secondary | ICD-10-CM | POA: Diagnosis not present

## 2017-02-07 DIAGNOSIS — Z881 Allergy status to other antibiotic agents status: Secondary | ICD-10-CM | POA: Diagnosis not present

## 2017-02-07 DIAGNOSIS — E7800 Pure hypercholesterolemia, unspecified: Secondary | ICD-10-CM | POA: Diagnosis not present

## 2017-02-07 DIAGNOSIS — Z79899 Other long term (current) drug therapy: Secondary | ICD-10-CM | POA: Diagnosis not present

## 2017-02-07 DIAGNOSIS — Z87891 Personal history of nicotine dependence: Secondary | ICD-10-CM | POA: Diagnosis not present

## 2017-02-07 DIAGNOSIS — E119 Type 2 diabetes mellitus without complications: Secondary | ICD-10-CM | POA: Diagnosis not present

## 2017-02-07 DIAGNOSIS — Z955 Presence of coronary angioplasty implant and graft: Secondary | ICD-10-CM | POA: Diagnosis not present

## 2017-02-07 DIAGNOSIS — K219 Gastro-esophageal reflux disease without esophagitis: Secondary | ICD-10-CM | POA: Diagnosis not present

## 2017-02-07 DIAGNOSIS — Z6841 Body Mass Index (BMI) 40.0 and over, adult: Secondary | ICD-10-CM | POA: Diagnosis not present

## 2017-02-07 DIAGNOSIS — Z7901 Long term (current) use of anticoagulants: Secondary | ICD-10-CM | POA: Diagnosis not present

## 2017-02-07 DIAGNOSIS — I110 Hypertensive heart disease with heart failure: Secondary | ICD-10-CM | POA: Diagnosis not present

## 2017-02-07 DIAGNOSIS — Z951 Presence of aortocoronary bypass graft: Secondary | ICD-10-CM | POA: Diagnosis not present

## 2017-02-07 DIAGNOSIS — I4892 Unspecified atrial flutter: Secondary | ICD-10-CM | POA: Diagnosis not present

## 2017-02-07 DIAGNOSIS — I2510 Atherosclerotic heart disease of native coronary artery without angina pectoris: Secondary | ICD-10-CM | POA: Diagnosis not present

## 2017-02-07 HISTORY — DX: Gastro-esophageal reflux disease without esophagitis: K21.9

## 2017-02-07 HISTORY — DX: Cardiac arrhythmia, unspecified: I49.9

## 2017-02-07 LAB — CBC WITH DIFFERENTIAL/PLATELET
BASOS ABS: 0 10*3/uL (ref 0.0–0.1)
BASOS PCT: 0 %
EOS ABS: 0.1 10*3/uL (ref 0.0–0.7)
EOS PCT: 1 %
HCT: 43.3 % (ref 39.0–52.0)
Hemoglobin: 13.1 g/dL (ref 13.0–17.0)
LYMPHS ABS: 1.7 10*3/uL (ref 0.7–4.0)
Lymphocytes Relative: 18 %
MCH: 26.1 pg (ref 26.0–34.0)
MCHC: 30.3 g/dL (ref 30.0–36.0)
MCV: 86.4 fL (ref 78.0–100.0)
Monocytes Absolute: 0.7 10*3/uL (ref 0.1–1.0)
Monocytes Relative: 7 %
Neutro Abs: 7 10*3/uL (ref 1.7–7.7)
Neutrophils Relative %: 74 %
PLATELETS: 124 10*3/uL — AB (ref 150–400)
RBC: 5.01 MIL/uL (ref 4.22–5.81)
RDW: 16.3 % — ABNORMAL HIGH (ref 11.5–15.5)
WBC: 9.6 10*3/uL (ref 4.0–10.5)

## 2017-02-07 NOTE — Progress Notes (Signed)
   02/07/17 1322  OBSTRUCTIVE SLEEP APNEA  Have you ever been diagnosed with sleep apnea through a sleep study? No  Do you snore loudly (loud enough to be heard through closed doors)?  1  Do you often feel tired, fatigued, or sleepy during the daytime (such as falling asleep during driving or talking to someone)? 1  Do you have, or are you being treated for high blood pressure? 1  BMI more than 35 kg/m2? 1  Age > 50 (1-yes) 1  Neck circumference greater than:Male 16 inches or larger, Male 17inches or larger? 1  Male Gender (Yes=1) 1  Obstructive Sleep Apnea Score 7  Score 5 or greater  Results sent to PCP

## 2017-02-09 ENCOUNTER — Encounter (HOSPITAL_COMMUNITY): Payer: Self-pay | Admitting: *Deleted

## 2017-02-09 ENCOUNTER — Ambulatory Visit (HOSPITAL_COMMUNITY): Payer: Medicare Other | Admitting: Anesthesiology

## 2017-02-09 ENCOUNTER — Encounter (HOSPITAL_COMMUNITY)
Admission: RE | Disposition: A | Discharge status: Home / Self Care | Payer: Self-pay | Source: Ambulatory Visit | Attending: Cardiology

## 2017-02-09 ENCOUNTER — Other Ambulatory Visit: Payer: Self-pay | Admitting: *Deleted

## 2017-02-09 ENCOUNTER — Ambulatory Visit (HOSPITAL_COMMUNITY)
Admission: RE | Admit: 2017-02-09 | Discharge: 2017-02-09 | Disposition: A | Discharge status: Home / Self Care | Payer: Medicare Other | Source: Ambulatory Visit | Attending: Cardiology | Admitting: Cardiology

## 2017-02-09 DIAGNOSIS — K219 Gastro-esophageal reflux disease without esophagitis: Secondary | ICD-10-CM | POA: Insufficient documentation

## 2017-02-09 DIAGNOSIS — I110 Hypertensive heart disease with heart failure: Secondary | ICD-10-CM | POA: Insufficient documentation

## 2017-02-09 DIAGNOSIS — Z6841 Body Mass Index (BMI) 40.0 and over, adult: Secondary | ICD-10-CM | POA: Insufficient documentation

## 2017-02-09 DIAGNOSIS — Z955 Presence of coronary angioplasty implant and graft: Secondary | ICD-10-CM | POA: Insufficient documentation

## 2017-02-09 DIAGNOSIS — I4892 Unspecified atrial flutter: Secondary | ICD-10-CM | POA: Diagnosis not present

## 2017-02-09 DIAGNOSIS — Z7901 Long term (current) use of anticoagulants: Secondary | ICD-10-CM | POA: Insufficient documentation

## 2017-02-09 DIAGNOSIS — I5033 Acute on chronic diastolic (congestive) heart failure: Secondary | ICD-10-CM | POA: Diagnosis not present

## 2017-02-09 DIAGNOSIS — E119 Type 2 diabetes mellitus without complications: Secondary | ICD-10-CM | POA: Insufficient documentation

## 2017-02-09 DIAGNOSIS — I2510 Atherosclerotic heart disease of native coronary artery without angina pectoris: Secondary | ICD-10-CM | POA: Insufficient documentation

## 2017-02-09 DIAGNOSIS — E7800 Pure hypercholesterolemia, unspecified: Secondary | ICD-10-CM | POA: Insufficient documentation

## 2017-02-09 DIAGNOSIS — Z951 Presence of aortocoronary bypass graft: Secondary | ICD-10-CM | POA: Insufficient documentation

## 2017-02-09 DIAGNOSIS — Z881 Allergy status to other antibiotic agents status: Secondary | ICD-10-CM | POA: Insufficient documentation

## 2017-02-09 DIAGNOSIS — I252 Old myocardial infarction: Secondary | ICD-10-CM | POA: Insufficient documentation

## 2017-02-09 DIAGNOSIS — Z79899 Other long term (current) drug therapy: Secondary | ICD-10-CM | POA: Insufficient documentation

## 2017-02-09 DIAGNOSIS — Z87891 Personal history of nicotine dependence: Secondary | ICD-10-CM | POA: Insufficient documentation

## 2017-02-09 HISTORY — PX: CARDIOVERSION: SHX1299

## 2017-02-09 LAB — GLUCOSE, CAPILLARY: Glucose-Capillary: 103 mg/dL — ABNORMAL HIGH (ref 65–99)

## 2017-02-09 SURGERY — CARDIOVERSION
Anesthesia: Monitor Anesthesia Care

## 2017-02-09 MED ORDER — LACTATED RINGERS IV SOLN
INTRAVENOUS | Status: DC
  Administered 2017-02-09: 08:00:00 via INTRAVENOUS

## 2017-02-09 MED ORDER — PROPOFOL 500 MG/50ML IV EMUL
INTRAVENOUS | Status: DC | PRN
  Administered 2017-02-09: 75 ug/kg/min via INTRAVENOUS

## 2017-02-09 MED ORDER — FENTANYL CITRATE (PF) 100 MCG/2ML IJ SOLN: 25.0000 ug | INTRAMUSCULAR | Status: AC

## 2017-02-09 MED ORDER — MIDAZOLAM HCL 2 MG/2ML IJ SOLN: 1.0000 mg | INTRAMUSCULAR | Status: AC

## 2017-02-09 MED ORDER — METOPROLOL TARTRATE 50 MG PO TABS: 50.0000 mg | ORAL_TABLET | Freq: Two times a day (BID) | ORAL | 3 refills | Status: AC

## 2017-02-09 MED ORDER — FENTANYL CITRATE (PF) 100 MCG/2ML IJ SOLN
INTRAMUSCULAR | Status: AC
  Filled 2017-02-09: qty 2

## 2017-02-09 MED ORDER — PROPOFOL 10 MG/ML IV BOLUS
INTRAVENOUS | Status: AC
  Filled 2017-02-09: qty 40

## 2017-02-09 MED ORDER — PROPOFOL 10 MG/ML IV BOLUS
INTRAVENOUS | Status: DC | PRN
  Administered 2017-02-09: 20 mg via INTRAVENOUS

## 2017-02-09 MED ORDER — MIDAZOLAM HCL 2 MG/2ML IJ SOLN
INTRAMUSCULAR | Status: AC
  Filled 2017-02-09: qty 2

## 2017-02-09 MED ORDER — IPRATROPIUM-ALBUTEROL 0.5-2.5 (3) MG/3ML IN SOLN
3.0000 mL | Freq: Once | RESPIRATORY_TRACT | Status: AC
  Administered 2017-02-09: 3 mL via RESPIRATORY_TRACT

## 2017-02-09 NOTE — CV Procedure (Signed)
Procedure: DCCV Physician: Dr Reginald RichJonathan Anjeli Smith Indication: aflutter with RVR   Patient was brought to the procedure suite after appropriate consent was obtained. Sedation was achieved with the assistance of anesthesiology, please refer to there note for details. He was sucesfully cardioverted with a single synchronized 200j shock to NSR. Cardiopulmonary monitoring was performed throughout the procedure. He tolerated the procedure well without side effects.   Reginald RichJonathan [name] MD

## 2017-02-09 NOTE — Discharge Instructions (Addendum)
Decrease Metoprolol to 50 mg twice a day   Electrical Cardioversion, Care After This sheet gives you information about how to care for yourself after your procedure. Your health care provider may also give you more specific instructions. If you have problems or questions, contact your health care provider. What can I expect after the procedure? After the procedure, it is common to have:  Some redness on the skin where the shocks were given. Follow these instructions at home:  Do not drive for 24 hours if you were given a medicine to help you relax (sedative).  Take over-the-counter and prescription medicines only as told by your health care provider.  Ask your health care provider how to check your pulse. Check it often.  Rest for 48 hours after the procedure or as told by your health care provider.  Avoid or limit your caffeine use as told by your health care provider. Contact a health care provider if:  You feel like your heart is beating too quickly or your pulse is not regular.  You have a serious muscle cramp that does not go away. Get help right away if:  You have discomfort in your chest.  You are dizzy or you feel faint.  You have trouble breathing or you are short of breath.  Your speech is slurred.  You have trouble moving an arm or leg on one side of your body.  Your fingers or toes turn cold or blue. This information is not intended to replace advice given to you by your health care provider. Make sure you discuss any questions you have with your health care provider. Document Released: 08/21/2013 Document Revised: 06/03/2016 Document Reviewed: 05/06/2016 Elsevier Interactive Patient Education  2017 [employer]

## 2017-02-09 NOTE — Transfer of Care (Signed)
Immediate Anesthesia Transfer of Care Note  Patient: [name]  Procedure(s) Performed: Procedure(s): CARDIOVERSION (N/A)  Patient Location: PACU  Anesthesia Type:MAC  Level of Consciousness: awake  Airway & Oxygen Therapy: Patient Spontanous Breathing and non-rebreather face mask  Post-op Assessment: Report given to RN and Post -op Vital signs reviewed and stable  Post vital signs: Reviewed and stable  Last Vitals:  Vitals:   02/09/17 0825 02/09/17 0830  BP: 114/75 120/81  Pulse:    Resp: (!) 33 (!) 35  Temp:      Last Pain:  Vitals:   02/09/17 0753  TempSrc: Oral      Patients Stated Pain Goal: 5 (15/94/58 [id number])  Complications: No apparent anesthesia complications

## 2017-02-09 NOTE — H&P (Signed)
Please see my reference clinic note for full history. Patient presents today for electrical cardioversion for aflutter. He has been on anticoagulation uninterrupted over 3 weeks.   Reginald FerryJ Varie Machamer MD  Clinical Summary [name] is a 71 y.o.male seen today for follow up of the following medical problems.   1. 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  - had lost 7 lbs last visit with increased diuretics. Today weight back up 8 lbs. Increased edema, ongoing SOB.     2. Aflutter - new diagnosis last visit, rates elevated 130s - we increased his lopressor to 50mg  bid, started eliquis 5mg  bid for CHADS2Vasc score of 3 - no recent palpitaitons.     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      Past Medical History:  Diagnosis Date  . Asthma    AS CHILD  . CAD (coronary artery disease), native coronary artery 2006   [place]; Cath 02/06/12 Severe three-vessel coronary artery disease with critical stenosis in the mid LAD, severe stenosis of the left circumflex, and total occlusion of a small Hilliary Jock of the right coronary artery with moderate disease in the remaining portions of the RCA. Preserved LV function   . Congestive heart failure, unspecified    EF 55-65% by cath 02/06/12  . Diabetes mellitus   . Edema   . Hypertension   . Morbid obesity (HCC)   . Myocardial infarction    DR COOPER   . Osteoarthrosis, unspecified whether generalized or localized, unspecified site   . Other malaise and fatigue   . Pure hypercholesterolemia   . Shortness of breath    WITH EXERTION   . Swelling of limb   . Tobacco abuse           Allergies  Allergen Reactions  . Ciprofloxacin Hcl Other (See Comments)    Fluoroquinolones:difficulty swallowing and Sore mouth           Current Outpatient Prescriptions  Medication Sig Dispense Refill  . apixaban (ELIQUIS) 5 MG TABS tablet Take 1 tablet  (5 mg total) by mouth 2 (two) times daily. 42 tablet 0  . atorvastatin (LIPITOR) 80 MG tablet TAKE 1 TABLET BY MOUTH EVERY DAY DOSE INCREASED 11-27-14 30 tablet 6  . colchicine 0.6 MG tablet Take 0.6 mg by mouth 2 (two) times daily as needed. For gout flare    . furosemide (LASIX) 40 MG tablet TAKE 40 MG IN THE MORNING AND 20 MG IN THE EVENING    . lisinopril (PRINIVIL,ZESTRIL) 10 MG tablet TAKE 1 TABLET BY MOUTH EVERY DAY 30 tablet 6  . metoprolol (LOPRESSOR) 50 MG tablet Take 1.5 tablets (75 mg total) by mouth 2 (two) times daily. 180 tablet 3  . polyethylene glycol (MIRALAX / GLYCOLAX) packet Take 17 g by mouth daily as needed.     . ranitidine (ZANTAC) 150 MG tablet Take 150 mg by mouth 2 (two) times daily as needed for heartburn.     No current facility-administered medications for this visit.           Past Surgical History:  Procedure Laterality Date  . CARDIAC CATHETERIZATION    . CORONARY ANGIOPLASTY WITH STENT PLACEMENT    . CORONARY ARTERY BYPASS GRAFT  02/22/2012   Procedure: CORONARY ARTERY BYPASS GRAFTING (CABG);  Surgeon: Loreli SlotSteven C Hendrickson, MD;  [facility];  Service: Open Heart Surgery;  Laterality: N/A;  CABG x four;  using left internal mammary artery and right leg greater saphenous vein harvested endoscopically  . LEFT HEART CATHETERIZATION WITH CORONARY ANGIOGRAM N/A 02/06/2012   Procedure: LEFT HEART CATHETERIZATION WITH CORONARY ANGIOGRAM;  Surgeon: Tonny Bollman, MD;  [facility];  Service: Cardiovascular;  Laterality: N/A;          Allergies  Allergen Reactions  . Ciprofloxacin Hcl Other (See Comments)    Fluoroquinolones:difficulty swallowing and Sore mouth           Family History  Problem Relation Age of Onset  . Heart disease Mother   . Heart disease Sister      Social History [name] reports that he quit smoking about 4 years ago. His smoking use included Cigarettes. He started smoking about 57  years ago. He has a 20.00 pack-year smoking history. He has never used smokeless tobacco. [name] reports that he does not drink alcohol.   Review of Systems CONSTITUTIONAL: No weight loss, fever, chills, weakness or fatigue.  HEENT: Eyes: No visual loss, blurred vision, double vision or yellow sclerae.No hearing loss, sneezing, congestion, runny nose or sore throat.  SKIN: No rash or itching.  CARDIOVASCULAR: per hpi RESPIRATORY: No shortness of breath, cough or sputum.  GASTROINTESTINAL: No anorexia, nausea, vomiting or diarrhea. No abdominal pain or blood.  GENITOURINARY: No burning on urination, no polyuria NEUROLOGICAL: No headache, dizziness, syncope, paralysis, ataxia, numbness or tingling in the extremities. No change in bowel or bladder control.  MUSCULOSKELETAL: No muscle, back pain, joint pain or stiffness.  LYMPHATICS: No enlarged nodes. No history of splenectomy.  PSYCHIATRIC: No history of depression or anxiety.  ENDOCRINOLOGIC: No reports of sweating, cold or heat intolerance. No polyuria or polydipsia.  [name]   Physical Examination    Vitals:   02/02/17 1515  BP: 114/72  Pulse: 89      Vitals:   02/02/17 1515  Weight: (!) 317 lb 12.8 oz (144.2 kg)  Height: 5\' 6"  (1.676 m)    Gen: resting comfortably, no acute distress HEENT: no scleral icterus, pupils equal round and reactive, no palptable cervical adenopathy,  CV: RRR, no m/rg, no jvd Resp: Clear to auscultation bilaterally GI: abdomen is soft, non-tender, non-distended, normal bowel sounds, no hepatosplenomegaly MSK: extremities are warm, no edema.  Skin: warm, no rash Neuro:  no focal deficits Psych: appropriate affect   Diagnostic Studies 01/2012 Cath Procedural Findings: Hemodynamics: AO 114/61 LV 114/21  Coronary angiography: Coronary dominance: right  Left mainstem: The left main is patent with mild tapering at the distal vessel but no more than 20% stenosis is noted. The left  main divides into the LAD and left circumflex.  Left anterior descending (LAD): The LAD has critical stenosis in the mid vessel. The proximal vessel has mild luminal irregularity until the first septal perforator. Just beyond the first perforator there is 95-99% stenosis present. There is a dilated segment just at the origin of the second diagonal Cipriano Millikan. The LAD is segmentally diseased in the mid vessel with 90% stenosis beyond the diagonal origin. The diagonal Damier Disano has no obstructive disease present. The distal LAD has diffuse nonobstructive disease.  Left circumflex (LCx): The left circumflex is patent in the proximal aspect. The mid vessel has sequential high-grade lesions with the first lesion having 75-80% stenosis and the second lesion with at least 80% stenosis leading into a large second obtuse marginal Draya Felker. The second OM also has a high-grade lesion in the distal portion of that vessel that is only appreciated  in the cranial views.  Right coronary artery (RCA): The right coronary artery is a large, dominant vessel. The vessel is diffusely diseased with no high-grade obstruction through the proximal and midportion. At the junction of the mid and distal RCA there is a 60% smooth stenosis present. The vessel gives off a large PDA Elsie Sakuma with diffuse disease in the 50-70% range throughout the proximal PDA. The posterolateral Jalayah Gutridge is occluded and the stent is not visible. The posterolateral Rhapsody Wolven fills from left to right collaterals.  Left ventriculography: Left ventricular systolic function is normal, LVEF is estimated at 55-65%, there is no significant mitral regurgitation   Final Conclusions:  1. Severe three-vessel coronary artery disease with critical stenosis in the mid LAD, severe stenosis of the left circumflex, and total occlusion of a small Timaya Bojarski of the right coronary artery with moderate disease in the remaining portions of the RCA. 2. Preserved LV  function  Recommendations: The patient has a large amount of myocardium in jeopardy. He is coronary disease is anatomically amenable to PCI. A large diagonal Jacey Pelc would have to be crossed with a stent in the mid LAD, but the ostium of the diagonal is widely patent and I suspect there would be no significant residual stenosis after treating that area. However, the patient would require at least 3 stents (1 in the mid LAD, 2 in the left circumflex). In this patient who has developed type 2 diabetes and has shown no pattern of medical compliance as he continues to smoke and does not follow a diet, I'm not sure that percutaneous treatment is his best therapy. I am going to request consultation from cardiac surgery for consideration of coronary bypass. If he is deemed not to be a good candidate because of his morbid obesity, I will proceed with PCI later this week. In the interim, will hold his Plavix since surgery is being considered. He will be started on IV heparin. All of this was discussed with the patient and his family in detail.   06/2015 Carotid US 40-59% bilateral ICA stenosis.  12/16/15 Clinic EKG (performed and reviewed in clinic): NSR     Assessment and Plan   1. . Aflutter - ekg in clinic shows aflutter, elevated rates. We will increase lopressor to 100mg  bid - CHADS2Vasc score is 3, continueeliquis 5mg  bid - he is to contact us next week to arrange DCCV  2. Acute diastolic congestive heart failure - likely exacerbated by his aflutter and tachycardia - weight is back up since last visit. We will increaes lasix again to 40mg  bid.          Antoine Poche, M.D.  Reginald Ferry MD

## 2017-02-09 NOTE — Anesthesia Postprocedure Evaluation (Signed)
Anesthesia Post Note  Patient: [name]  Procedure(s) Performed: Procedure(s) (LRB): CARDIOVERSION (N/A)  Patient location during evaluation: Short Stay Anesthesia Type: MAC Level of consciousness: awake and alert Pain management: satisfactory to patient Vital Signs Assessment: post-procedure vital signs reviewed and stable Respiratory status: spontaneous breathing and patient connected to face mask oxygen Cardiovascular status: stable Anesthetic complications: no Comments: Patient getting breathing treatment and states he feel like a new man.     Last Vitals:  Vitals:   02/09/17 0945 02/09/17 0952  BP: (!) 102/56 104/70  Pulse: 74   Resp: (!) 27   Temp: 36.4 C     Last Pain:  Vitals:   02/09/17 0753  TempSrc: Oral                 Brandin Stetzer

## 2017-02-09 NOTE — Progress Notes (Signed)
Electrical Cardioversion Procedure Note [name] [id number] [birth year]  Procedure: Electrical Cardioversion Indications:  Atrial Fibrillation  Procedure Details Consent: Risks of procedure as well as the alternatives and risks of each were explained to the (patient/caregiver).  Consent for procedure obtained. Time Out: Verified patient identification, verified procedure, site/side was marked, verified correct patient position, special equipment/implants available, medications/allergies/relevent history reviewed, required imaging and test results available.  Time out performed at 841 AM Patient placed on cardiac monitor, pulse oximetry, supplemental oxygen as necessary.  Sedation given: See anesthesia note Pacer pads placed anterior and posterior chest.  Cardioverted 1 time(s).  Cardioverted at 845 using 200 Reginald Evaluation Findings: Post procedure EKG shows: NSR Complications: None Patient did tolerate procedure well.   [name], [name] 02/09/2017, 9:17 AM

## 2017-02-09 NOTE — Anesthesia Preprocedure Evaluation (Signed)
Anesthesia Evaluation  Patient identified by MRN, date of birth, ID band Patient awake    Reviewed: Allergy & Precautions, NPO status , Patient's Chart, lab work & pertinent test results  Airway Mallampati: II  TM Distance: >3 FB Neck ROM: Full    Dental  (+) Edentulous Upper, Poor Dentition, Loose, Missing,    Pulmonary shortness of breath, asthma , former smoker,    breath sounds clear to auscultation       Cardiovascular hypertension, + CAD, + Past MI, + CABG, +CHF and + DOE  + dysrhythmias  Rhythm:Regular Rate:Normal     Neuro/Psych    GI/Hepatic GERD  ,  Endo/Other  diabetes, Type 2  Renal/GU      Musculoskeletal   Abdominal   Peds  Hematology   Anesthesia Other Findings   Reproductive/Obstetrics                             Anesthesia Physical Anesthesia Plan  ASA: III  Anesthesia Plan: MAC   Post-op Pain Management:    Induction: Intravenous  Airway Management Planned: Simple Face Mask  Additional Equipment:   Intra-op Plan:   Post-operative Plan:   Informed Consent: I have reviewed the patients History and Physical, chart, labs and discussed the procedure including the risks, benefits and alternatives for the proposed anesthesia with the patient or authorized representative who has indicated his/her understanding and acceptance.     Plan Discussed with:   Anesthesia Plan Comments:         Anesthesia Quick Evaluation

## 2017-02-09 NOTE — Anesthesia Procedure Notes (Signed)
Procedure Name: MAC Date/Time: 02/09/2017 8:36 AM Performed by: Vista Deck Pre-anesthesia Checklist: Patient identified, Emergency Drugs available, Suction available, Timeout performed and Patient being monitored Patient Re-evaluated:Patient Re-evaluated prior to inductionOxygen Delivery Method: Non-rebreather mask

## 2017-02-10 ENCOUNTER — Telehealth: Payer: Self-pay | Admitting: Internal Medicine

## 2017-02-10 NOTE — Telephone Encounter (Signed)
Patient called noting lack of sleep and feeling shakey.  He was curious about medication reactions.  We reviewed his list without an obvious offender and re-iterated the importance of his blood thinner post DCCV.  He will call the clinic Monday to let them know how he is doing and at any point he will come to the ED if he feels worse or is concerned.

## 2017-02-13 ENCOUNTER — Encounter (HOSPITAL_COMMUNITY): Payer: Self-pay | Admitting: Cardiology

## 2017-02-15 ENCOUNTER — Telehealth: Payer: Self-pay | Admitting: *Deleted

## 2017-02-15 DIAGNOSIS — I10 Essential (primary) hypertension: Secondary | ICD-10-CM

## 2017-02-15 NOTE — Telephone Encounter (Signed)
Pt says he hasn't had labs done - will mail lab orders - says he is feeling much better and has been urinating more and sleeping better at night.

## 2017-02-15 NOTE — Telephone Encounter (Signed)
-----   Message from Antoine Poche, MD sent at 02/10/2017  1:51 PM EDT ----- This patient was to have BMET and MG, looks like only cbc done. Can we look into it please    JBranch MD

## 2017-02-20 ENCOUNTER — Telehealth: Payer: Self-pay | Admitting: Cardiology

## 2017-02-20 NOTE — Telephone Encounter (Signed)
Someone called for patient stating that he was asking her to take him to have a breathing treatment.   She said that she was not with him but wanted our office to call him to see if he needed to come back and see Dr Wyline Mood

## 2017-02-20 NOTE — Telephone Encounter (Signed)
Pt says he was given a breathing treatment and wanted to know if he needed oxygen or inhaler. Advised pt to contact Dr Sherryll Burger about this for appt - pt denies any cardiac issues and has f/u with Dr Wyline Mood on 4/23 - pt agreeable and will contact Dr Sherryll Burger for earlier appt

## 2017-03-01 ENCOUNTER — Other Ambulatory Visit: Payer: Self-pay | Admitting: Cardiology

## 2017-03-06 ENCOUNTER — Ambulatory Visit: Payer: Medicare Other | Admitting: Cardiology

## 2017-08-14 DEATH — deceased
